# Patient Record
Sex: Female | Born: 1980
Health system: Southern US, Community
[De-identification: ages and names within clinical notes are randomized; demographics above are authoritative.]

## PROBLEM LIST (undated history)

## (undated) DIAGNOSIS — K219 Gastro-esophageal reflux disease without esophagitis: Secondary | ICD-10-CM

## (undated) DIAGNOSIS — T7840XA Allergy, unspecified, initial encounter: Secondary | ICD-10-CM

## (undated) DIAGNOSIS — F329 Major depressive disorder, single episode, unspecified: Secondary | ICD-10-CM

## (undated) DIAGNOSIS — N159 Renal tubulo-interstitial disease, unspecified: Secondary | ICD-10-CM

## (undated) DIAGNOSIS — E282 Polycystic ovarian syndrome: Secondary | ICD-10-CM

## (undated) DIAGNOSIS — F419 Anxiety disorder, unspecified: Secondary | ICD-10-CM

## (undated) DIAGNOSIS — R519 Headache, unspecified: Secondary | ICD-10-CM

## (undated) DIAGNOSIS — R51 Headache: Secondary | ICD-10-CM

## (undated) HISTORY — DX: Headache, unspecified: R51.9

## (undated) HISTORY — DX: Gastro-esophageal reflux disease without esophagitis: K21.9

## (undated) HISTORY — DX: Anxiety disorder, unspecified: F41.9

## (undated) HISTORY — DX: Headache: R51

## (undated) HISTORY — DX: Allergy, unspecified, initial encounter: T78.40XA

## (undated) HISTORY — DX: Major depressive disorder, single episode, unspecified: F32.9

---

## 2003-06-01 DIAGNOSIS — J329 Chronic sinusitis, unspecified: Secondary | ICD-10-CM | POA: Insufficient documentation

## 2003-06-01 DIAGNOSIS — N926 Irregular menstruation, unspecified: Secondary | ICD-10-CM | POA: Insufficient documentation

## 2003-07-24 DIAGNOSIS — F418 Other specified anxiety disorders: Secondary | ICD-10-CM | POA: Insufficient documentation

## 2003-12-28 DIAGNOSIS — M2550 Pain in unspecified joint: Secondary | ICD-10-CM | POA: Insufficient documentation

## 2004-03-28 DIAGNOSIS — R9089 Other abnormal findings on diagnostic imaging of central nervous system: Secondary | ICD-10-CM | POA: Insufficient documentation

## 2009-03-26 DIAGNOSIS — G43909 Migraine, unspecified, not intractable, without status migrainosus: Secondary | ICD-10-CM | POA: Insufficient documentation

## 2009-07-18 DIAGNOSIS — Z3400 Encounter for supervision of normal first pregnancy, unspecified trimester: Secondary | ICD-10-CM | POA: Insufficient documentation

## 2009-11-06 DIAGNOSIS — E039 Hypothyroidism, unspecified: Secondary | ICD-10-CM | POA: Insufficient documentation

## 2012-05-05 ENCOUNTER — Emergency Department (HOSPITAL_COMMUNITY)
Admission: EM | Admit: 2012-05-05 | Discharge: 2012-05-05 | Discharge status: Against Medical Advice | Payer: Medicaid Other | Attending: Emergency Medicine | Admitting: Emergency Medicine

## 2012-05-05 ENCOUNTER — Encounter (HOSPITAL_COMMUNITY): Payer: Self-pay | Admitting: Emergency Medicine

## 2012-05-05 DIAGNOSIS — Z8742 Personal history of other diseases of the female genital tract: Secondary | ICD-10-CM | POA: Insufficient documentation

## 2012-05-05 DIAGNOSIS — R197 Diarrhea, unspecified: Secondary | ICD-10-CM | POA: Insufficient documentation

## 2012-05-05 HISTORY — DX: Polycystic ovarian syndrome: E28.2

## 2012-05-05 LAB — CBC WITH DIFFERENTIAL/PLATELET
Eosinophils Absolute: 0.1 10*3/uL (ref 0.0–0.7)
Hemoglobin: 13.8 g/dL (ref 12.0–15.0)
Lymphocytes Relative: 32 % (ref 12–46)
Lymphs Abs: 2.5 10*3/uL (ref 0.7–4.0)
MCH: 29.2 pg (ref 26.0–34.0)
Monocytes Relative: 8 % (ref 3–12)
Neutro Abs: 4.6 10*3/uL (ref 1.7–7.7)
Neutrophils Relative %: 59 % (ref 43–77)
Platelets: 245 10*3/uL (ref 150–400)
RBC: 4.73 MIL/uL (ref 3.87–5.11)
WBC: 7.9 10*3/uL (ref 4.0–10.5)

## 2012-05-05 LAB — URINE MICROSCOPIC-ADD ON

## 2012-05-05 LAB — URINALYSIS, ROUTINE W REFLEX MICROSCOPIC
Bilirubin Urine: NEGATIVE
Nitrite: NEGATIVE
Specific Gravity, Urine: 1.015 (ref 1.005–1.030)
Urobilinogen, UA: 1 mg/dL (ref 0.0–1.0)
pH: 7.5 (ref 5.0–8.0)

## 2012-05-05 LAB — COMPREHENSIVE METABOLIC PANEL
ALT: 26 U/L (ref 0–35)
Alkaline Phosphatase: 70 U/L (ref 39–117)
BUN: 12 mg/dL (ref 6–23)
Chloride: 102 mEq/L (ref 96–112)
GFR calc Af Amer: >90 mL/min (ref >90)
Glucose, Bld: 88 mg/dL (ref 70–99)
Potassium: 3.8 mEq/L (ref 3.5–5.1)
Sodium: 136 mEq/L (ref 135–145)
Total Bilirubin: 0.2 mg/dL — ABNORMAL LOW (ref 0.3–1.2)
Total Protein: 7.3 g/dL (ref 6.0–8.3)

## 2012-05-05 NOTE — ED Notes (Signed)
Hx of pcos feels like that pain

## 2012-05-05 NOTE — ED Notes (Signed)
Had diarrhea on Sunday and then she had left lower abd pain hurts to walk hurts to void

## 2012-05-05 NOTE — ED Notes (Signed)
Pt called to re check vitals and no answer

## 2012-05-05 NOTE — ED Notes (Signed)
Called x 3 with no answer.

## 2012-05-09 DIAGNOSIS — R110 Nausea: Secondary | ICD-10-CM | POA: Insufficient documentation

## 2012-05-09 DIAGNOSIS — Z87448 Personal history of other diseases of urinary system: Secondary | ICD-10-CM | POA: Insufficient documentation

## 2012-05-09 DIAGNOSIS — N7013 Chronic salpingitis and oophoritis: Secondary | ICD-10-CM | POA: Insufficient documentation

## 2012-05-09 DIAGNOSIS — Z79899 Other long term (current) drug therapy: Secondary | ICD-10-CM | POA: Insufficient documentation

## 2012-05-10 ENCOUNTER — Encounter (HOSPITAL_COMMUNITY): Payer: Self-pay

## 2012-05-10 ENCOUNTER — Emergency Department (HOSPITAL_COMMUNITY)
Admission: EM | Admit: 2012-05-10 | Discharge: 2012-05-10 | Disposition: A | Discharge status: Home / Self Care | Payer: Medicaid Other | Attending: Emergency Medicine | Admitting: Emergency Medicine

## 2012-05-10 ENCOUNTER — Emergency Department (HOSPITAL_COMMUNITY): Payer: Medicaid Other

## 2012-05-10 DIAGNOSIS — N7011 Chronic salpingitis: Secondary | ICD-10-CM

## 2012-05-10 HISTORY — DX: Renal tubulo-interstitial disease, unspecified: N15.9

## 2012-05-10 LAB — COMPREHENSIVE METABOLIC PANEL
Alkaline Phosphatase: 68 U/L (ref 39–117)
BUN: 13 mg/dL (ref 6–23)
Chloride: 101 mEq/L (ref 96–112)
Creatinine, Ser: 0.56 mg/dL (ref 0.50–1.10)
GFR calc Af Amer: >90 mL/min (ref >90)
GFR calc non Af Amer: >90 mL/min (ref >90)
Glucose, Bld: 80 mg/dL (ref 70–99)
Potassium: 3.4 mEq/L — ABNORMAL LOW (ref 3.5–5.1)
Total Bilirubin: 0.3 mg/dL (ref 0.3–1.2)

## 2012-05-10 LAB — LIPASE, BLOOD: Lipase: 24 U/L (ref 11–59)

## 2012-05-10 LAB — CBC WITH DIFFERENTIAL/PLATELET
HCT: 41.1 % (ref 36.0–46.0)
Hemoglobin: 14.2 g/dL (ref 12.0–15.0)
Lymphs Abs: 2.7 10*3/uL (ref 0.7–4.0)
MCH: 29.2 pg (ref 26.0–34.0)
Monocytes Absolute: 0.7 10*3/uL (ref 0.1–1.0)
Monocytes Relative: 10 % (ref 3–12)
Neutro Abs: 3.5 10*3/uL (ref 1.7–7.7)
Neutrophils Relative %: 50 % (ref 43–77)
RBC: 4.87 MIL/uL (ref 3.87–5.11)

## 2012-05-10 LAB — URINALYSIS, ROUTINE W REFLEX MICROSCOPIC
Bilirubin Urine: NEGATIVE
Ketones, ur: NEGATIVE mg/dL
Nitrite: NEGATIVE
Protein, ur: NEGATIVE mg/dL
Urobilinogen, UA: 0.2 mg/dL (ref 0.0–1.0)

## 2012-05-10 MED ORDER — ACETAMINOPHEN 500 MG PO TABS: 500.0000 mg | ORAL_TABLET | Freq: Four times a day (QID) | ORAL | Status: DC | PRN

## 2012-05-10 MED ORDER — IOHEXOL 300 MG/ML  SOLN
100.0000 mL | Freq: Once | INTRAMUSCULAR | Status: AC | PRN
  Administered 2012-05-10: 100 mL via INTRAVENOUS

## 2012-05-10 MED ORDER — MORPHINE SULFATE 4 MG/ML IJ SOLN
4.0000 mg | Freq: Once | INTRAMUSCULAR | Status: AC
  Administered 2012-05-10: 4 mg via INTRAVENOUS
  Filled 2012-05-10: qty 1

## 2012-05-10 MED ORDER — SODIUM CHLORIDE 0.9 % IV SOLN: Freq: Once | INTRAVENOUS | Status: DC

## 2012-05-10 MED ORDER — OXYCODONE-ACETAMINOPHEN 5-325 MG PO TABS: 2.0000 | ORAL_TABLET | ORAL | Status: DC | PRN

## 2012-05-10 MED ORDER — ONDANSETRON HCL 4 MG/2ML IJ SOLN
4.0000 mg | Freq: Once | INTRAMUSCULAR | Status: AC
  Administered 2012-05-10: 4 mg via INTRAVENOUS
  Filled 2012-05-10: qty 2

## 2012-05-10 NOTE — Discharge Instructions (Signed)
Take tylenol as needed for pain. Tylenol is safe to take during breastfeeding. You may take Percocet for pain separate from tylenol since Percocet contains tylenol. Do not take Percocet while breastfeeding, as there is limited data concerning the safety of Percocet use during lactation. Follow up with your OBGYN for further evaluation/management of your hydrosalpinx. Refer to the information below for more details regarding your diagnosis. Return to the ED with worsening or concerning symptoms.    HYDROSALPINX The fallopian tubes are attached to the uterus (womb). Normally, the fallopian tube picks up an egg each month as it is released (or ovulated) from the ovary. Sperm can travel from the vagina through the uterus and will eventually meet the egg in one of the two tubes. Fertilization is when the egg and sperm join together to form an embryo (fertilized egg). The embryo travels through the tube into the uterus. When it reaches the uterus, it can implant into the uterine wall and develop into a baby. However, an old infection can cause the tubes to fill with fluid and enlarge (dilate). When this happens, the tube is called a hydrosalpinx. A normal pregnancy in the uterus cannot occur because the tube may be severely damaged and blocked. A blockage will not allow the egg and sperm to meet. What causes a hydrosalpinx? Hydrosalpinx is commonly caused by an old infection in the fallopian tubes. These infections may be caused by a sexually transmitted disease. Other causes include previous surgery (particularly surgeries on the tube) or severe adhesions of your pelvis. What are the symptoms of hydrosalpinx? Some women with hydrosalpinx may have constant or frequent pain in their lower belly or abdomen. A vaginal discharge can also be associated with this condition. Other women may not have any symptoms.

## 2012-05-10 NOTE — ED Notes (Signed)
Pt presents with NAD- pt c/o of lower stomach pain x 10 days.  LBM today normal. Seen at [facility] yet had to leave without getting results.  Pain continues

## 2012-05-10 NOTE — ED Provider Notes (Signed)
6:17 AM Patient signed out to me by Earley Favor, NP. Patient presents with history of abdominal pain. Patient disposition is pending abdominal CT scan. If negative, patient can be discharged with follow up.   7:26 AM Patient's abdominal CT shows left sided hydrosalpinx. Patient will follow up with her OBGYN for further evaluation and management. Patient informed of results and agreeable to plan.   Emilia Beck, [place] 05/10/12 [phone]

## 2012-05-10 NOTE — ED Provider Notes (Signed)
Medical screening examination/treatment/procedure(s) were performed by non-physician practitioner and as supervising physician I was immediately available for consultation/collaboration.  Sunnie Nielsen, MD 05/10/12 [phone]

## 2012-05-10 NOTE — ED Provider Notes (Signed)
History     CSN: [id number]  Arrival date & time 05/09/12  2353   First MD Initiated Contact with Patient 05/10/12 0324      Chief Complaint  Patient presents with  . Abdominal Pain  . Nausea    (Consider location/radiation/quality/duration/timing/severity/associated sxs/prior treatment) HPI Comments: Patient with 10 day of suprapubic and LLQ pain, soft stools and tonight nausea.  Denies fevers  Has Hx PCOS with IUD in place has not had menstrual cycle in over 3 yrs since IUD placed  Has Hx multiple ovarian cysts   Patient is a 31 y.o. female presenting with abdominal pain. The history is provided by the patient.  Abdominal Pain The primary symptoms of the illness include abdominal pain and nausea. The primary symptoms of the illness do not include fever, vomiting, diarrhea, dysuria, vaginal discharge or vaginal bleeding. The onset of the illness was gradual.  Symptoms associated with the illness do not include urgency.    Past Medical History  Diagnosis Date  . PCOS (polycystic ovarian syndrome)   . Kidney infection     History reviewed. No pertinent past surgical history.  No family history on file.  History  Substance Use Topics  . Smoking status: Never Smoker   . Smokeless tobacco: Not on file  . Alcohol Use: No    OB History    Grav Para Term Preterm Abortions TAB SAB Ect Mult Living                  Review of Systems  Constitutional: Negative for fever.  Gastrointestinal: Positive for nausea and abdominal pain. Negative for vomiting and diarrhea.  Genitourinary: Negative for dysuria, urgency, flank pain, vaginal bleeding, vaginal discharge, vaginal pain and pelvic pain.  Skin: Negative for rash and wound.    Allergies  Asa  Home Medications   Current Outpatient Rx  Name  Route  Sig  Dispense  Refill  . LEVONORGESTREL 20 MCG/24HR IU IUD   Intrauterine   1 each by Intrauterine route continuous.           BP 126/79  Pulse 88  Temp 98.1 F  (36.7 C) (Oral)  Resp 18  Ht 5\' 10"  (1.778 m)  Wt 184 lb (83.462 kg)  BMI 26.40 kg/m2  SpO2 100%  Physical Exam  Constitutional: She is oriented to person, place, and time. She appears well-developed and well-nourished.  HENT:  Head: Normocephalic.  Eyes: Pupils are equal, round, and reactive to light.  Neck: Normal range of motion.  Cardiovascular: Normal rate.   Pulmonary/Chest: Effort normal.  Abdominal: Soft. Bowel sounds are normal. She exhibits no distension. There is tenderness in the suprapubic area and left lower quadrant. There is no rebound. Hernia confirmed negative in the left inguinal area.    Musculoskeletal: Normal range of motion.  Neurological: She is alert and oriented to person, place, and time.  Skin: Skin is warm. No erythema.    ED Course  Procedures (including critical care time)  Labs Reviewed  COMPREHENSIVE METABOLIC PANEL - Abnormal; Notable for the following:    Potassium 3.4 (*)     All other components within normal limits  CBC WITH DIFFERENTIAL  LIPASE, BLOOD  URINALYSIS, ROUTINE W REFLEX MICROSCOPIC  PREGNANCY, URINE  CBC   No results found.   No diagnosis found.    MDM   Transfer of care to net shift who will review Ct Scan        Arman Filter, NP 05/10/12 2114

## 2012-05-11 NOTE — ED Provider Notes (Signed)
Medical screening examination/treatment/procedure(s) were performed by non-physician practitioner and as supervising physician I was immediately available for consultation/collaboration.  Sunnie Nielsen, MD 05/11/12 [phone]

## 2012-12-30 ENCOUNTER — Other Ambulatory Visit (HOSPITAL_COMMUNITY)
Admission: RE | Admit: 2012-12-30 | Discharge: 2012-12-30 | Disposition: A | Discharge status: Home / Self Care | Payer: Medicaid Other | Source: Ambulatory Visit | Attending: Obstetrics and Gynecology | Admitting: Obstetrics and Gynecology

## 2012-12-30 ENCOUNTER — Other Ambulatory Visit: Payer: Self-pay | Admitting: Obstetrics and Gynecology

## 2012-12-30 DIAGNOSIS — Z113 Encounter for screening for infections with a predominantly sexual mode of transmission: Secondary | ICD-10-CM | POA: Insufficient documentation

## 2012-12-30 DIAGNOSIS — Z01419 Encounter for gynecological examination (general) (routine) without abnormal findings: Secondary | ICD-10-CM | POA: Insufficient documentation

## 2012-12-30 DIAGNOSIS — Z1151 Encounter for screening for human papillomavirus (HPV): Secondary | ICD-10-CM | POA: Insufficient documentation

## 2013-04-08 ENCOUNTER — Other Ambulatory Visit (HOSPITAL_COMMUNITY): Payer: Self-pay | Admitting: Obstetrics and Gynecology

## 2013-04-08 DIAGNOSIS — N949 Unspecified condition associated with female genital organs and menstrual cycle: Secondary | ICD-10-CM

## 2013-04-12 ENCOUNTER — Ambulatory Visit (HOSPITAL_COMMUNITY)
Admission: RE | Admit: 2013-04-12 | Discharge: 2013-04-12 | Disposition: A | Discharge status: Home / Self Care | Payer: BC Managed Care – PPO | Source: Ambulatory Visit | Attending: Obstetrics and Gynecology | Admitting: Obstetrics and Gynecology

## 2013-04-12 ENCOUNTER — Other Ambulatory Visit (HOSPITAL_COMMUNITY): Payer: Self-pay | Admitting: Obstetrics and Gynecology

## 2013-04-12 DIAGNOSIS — N949 Unspecified condition associated with female genital organs and menstrual cycle: Secondary | ICD-10-CM

## 2013-04-12 DIAGNOSIS — N83209 Unspecified ovarian cyst, unspecified side: Secondary | ICD-10-CM | POA: Insufficient documentation

## 2013-04-12 DIAGNOSIS — Z30431 Encounter for routine checking of intrauterine contraceptive device: Secondary | ICD-10-CM | POA: Insufficient documentation

## 2013-04-12 DIAGNOSIS — E282 Polycystic ovarian syndrome: Secondary | ICD-10-CM | POA: Insufficient documentation

## 2014-09-27 ENCOUNTER — Encounter: Payer: Self-pay | Admitting: Diagnostic Neuroimaging

## 2014-09-27 ENCOUNTER — Ambulatory Visit (INDEPENDENT_AMBULATORY_CARE_PROVIDER_SITE_OTHER): Payer: BC Managed Care – PPO | Admitting: Diagnostic Neuroimaging

## 2014-09-27 VITALS — BP 101/73 | HR 76 | Temp 97.9°F | Resp 16 | Ht 69.0 in | Wt 200.0 lb

## 2014-09-27 DIAGNOSIS — G43019 Migraine without aura, intractable, without status migrainosus: Secondary | ICD-10-CM | POA: Diagnosis not present

## 2014-09-27 MED ORDER — TOPIRAMATE 25 MG PO TABS: 50.0000 mg | ORAL_TABLET | Freq: Every day | ORAL | Status: DC

## 2014-09-27 NOTE — Progress Notes (Signed)
[facility]  PATIENT: Heather Gardner DOB: 1981/03/03  REFERRING CLINICIAN: [name], MD HISTORY FROM: patient  REASON FOR VISIT: new consult    HISTORICAL  CHIEF COMPLAINT:  Chief Complaint  Patient presents with  . Headaches    Rm 6/ New patient     HISTORY OF PRESENT ILLNESS:   34 year old right-handed female with polycystic ovarian syndrome, migraine, depression, here for evaluation of headaches. Patient referred for consultation by Dr. [name].  Patient developed migraine headaches in her 58s. She describes occipital, posterior, throbbing headaches with photophobia, phonophobia, nausea. Patient was able to control headaches in the past with over-the-counter medications and more recently with topiramate and Maxalt. Patient also has had nocturnal vertex headaches with severe vomiting, shaking sensation, slurred speech, burning sensation on the top of her head. Headaches improved in her late 11s and early 83s.  Over past 1-2 months headaches have become more severe. She has missed 8 days of work recently due to severe headaches. She is again having 2 types of headaches. The more troublesome headaches are the nocturnal burning severe headaches. No specific triggering factors. Headaches seem to be worse in the springtime. She has been on antinausea medications, tramadol, ibuprofen and Tylenol without relief.   REVIEW OF SYSTEMS: Full 14 system review of systems performed and notable only for 2 much sleep decreased energy tremor slurred speech developed swelling headache sleepiness restless legs fevers and chills fatigue.  ALLERGIES: Allergies  Allergen Reactions  . Asa [Aspirin] Other (See Comments)    Patient stated that she can not take any blood thinners    HOME MEDICATIONS: Outpatient Prescriptions Prior to Visit  Medication Sig Dispense Refill  . FLUoxetine (PROZAC) 20 MG capsule     . ondansetron (ZOFRAN) 4 MG tablet     . promethazine (PHENERGAN)  25 MG tablet     . rizatriptan (MAXALT) 10 MG tablet     . topiramate (TOPAMAX) 25 MG tablet 75 mg.     . traMADol (ULTRAM) 50 MG tablet     . levonorgestrel (MIRENA) 20 MCG/24HR IUD 1 each by Intrauterine route continuous.     No facility-administered medications prior to visit.    PAST MEDICAL HISTORY: Past Medical History  Diagnosis Date  . PCOS (polycystic ovarian syndrome)   . Kidney infection   . Headache   . Major depression     PAST SURGICAL HISTORY: No past surgical history on file.  FAMILY HISTORY: Family History  Problem Relation Age of Onset  . Heart disease Mother   . Prostate cancer Father   . Throat cancer Father   . Kidney failure Maternal Grandmother   . Brain cancer Maternal Grandfather   . Lung cancer Maternal Grandfather   . Autism Son     SOCIAL HISTORY:  History   Social History  . Marital Status: Married    Spouse Name: N/A  . Number of Children: 1  . Years of Education: N/A   Occupational History  . [employer]    Social History Main Topics  . Smoking status: Never Smoker   . Smokeless tobacco: Not on file  . Alcohol Use: No  . Drug Use: No  . Sexual Activity: Not on file   Other Topics Concern  . Not on file   Social History Narrative     PHYSICAL EXAM  Filed Vitals:   09/27/14 0938  BP: 101/73  Pulse: 76  Temp: 97.9 F (36.6 C)  TempSrc: Oral  Resp: 16  Height: 5\' 9"  (1.753 m)  Weight: 200 lb (90.719 kg)    Body mass index is 29.52 kg/(m^2).   Visual Acuity Screening   Right eye Left eye Both eyes  Without correction:     With correction: 20/30 20/30 20/20     No flowsheet data found.  GENERAL EXAM: Patient is in no distress; well developed, nourished and groomed; neck is supple  CARDIOVASCULAR: Regular rate and rhythm, no murmurs, no carotid bruits  NEUROLOGIC: MENTAL STATUS: awake, alert, oriented to person, place and time, recent and remote memory intact, normal attention and  concentration, language fluent, comprehension intact, naming intact, fund of knowledge appropriate CRANIAL NERVE: no papilledema on fundoscopic exam, pupils equal and reactive to light, visual fields full to confrontation, extraocular muscles intact, no nystagmus, facial sensation and strength symmetric, hearing intact, palate elevates symmetrically, uvula midline, shoulder shrug symmetric, tongue midline. MOTOR: normal bulk and tone, full strength in the BUE, BLE SENSORY: normal and symmetric to light touch, pinprick, temperature, vibration COORDINATION: finger-nose-finger, fine finger movements normal REFLEXES: deep tendon reflexes present and symmetric GAIT/STATION: narrow based gait; romberg is negative    DIAGNOSTIC DATA (LABS, IMAGING, TESTING) - I reviewed patient records, labs, notes, testing and imaging myself where available.  Lab Results  Component Value Date   WBC 6.9 05/10/2012   HGB 14.2 05/10/2012   HCT 41.1 05/10/2012   MCV 84.4 05/10/2012   PLT 223 05/10/2012      Component Value Date/Time   NA 135 05/10/2012 0230   K 3.4* 05/10/2012 0230   CL 101 05/10/2012 0230   CO2 24 05/10/2012 0230   GLUCOSE 80 05/10/2012 0230   BUN 13 05/10/2012 0230   CREATININE 0.56 05/10/2012 0230   CALCIUM 9.3 05/10/2012 0230   PROT 7.4 05/10/2012 0230   ALBUMIN 3.9 05/10/2012 0230   AST 22 05/10/2012 0230   ALT 25 05/10/2012 0230   ALKPHOS 68 05/10/2012 0230   BILITOT 0.3 05/10/2012 0230   GFRNONAA >90 05/10/2012 0230   GFRAA >90 05/10/2012 0230   No results found for: CHOL, HDL, LDLCALC, LDLDIRECT, TRIG, CHOLHDL No results found for: HGBA1C No results found for: VITAMINB12 No results found for: TSH  March 2016 MRI brain ([facility]) - normal per referring MD notes    ASSESSMENT AND PLAN  34 y.o. year old female here with history of migraine since 2000's, now with increasing headaches. Normal neuro exam and MRI brain. 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  PLAN: - Increase topiramate to 50 mg twice a day. - Try adding on gabapentin 300 mg in the evening or at bedtime. - Come back to clinic on Friday morning for depacon infusion (valproic acid).  Return in about 1 month (around 10/28/2014) for revisit.    Penni Bombard, MD 10/14/3843, 36:46 AM Certified in Neurology, Neurophysiology and Neuroimaging  [facility] [address], [place], [place] [address] [phone]

## 2014-09-27 NOTE — Patient Instructions (Addendum)
Increase topiramate to 50 mg twice a day.  Try adding on gabapentin 300 mg in the evening or at bedtime.  Come back to clinic on Friday morning for depacon infusion (valproic acid).

## 2014-09-28 ENCOUNTER — Telehealth: Payer: Self-pay

## 2014-09-28 NOTE — Telephone Encounter (Signed)
Patient calling per Dr. Leta Baptist 09/27/14 Office Visit note patient to come have Depacon infusion on 09/29/14.  Spoke with Dr. Felecia Shelling to advise infusion nurse of order. Patient to arrive at 10am.

## 2014-10-02 ENCOUNTER — Telehealth: Payer: Self-pay | Admitting: Diagnostic Neuroimaging

## 2014-10-02 MED ORDER — GABAPENTIN 300 MG PO CAPS: 300.0000 mg | ORAL_CAPSULE | Freq: Every evening | ORAL | Status: DC

## 2014-10-02 NOTE — Telephone Encounter (Signed)
Last OV note says: Try adding on gabapentin 300 mg in the evening or at bedtime. It does not appear this drug is on med list.  I have added the Rx and sent it to the pharmacy.  I called the patient back, got no answer.  Left message.

## 2014-10-02 NOTE — Telephone Encounter (Signed)
Patient is calling to let you know the increased dosage of topiramate 25 mg to 50 mg did not work.  She stated you had another Rx to call in for her but the topiramate was called in again instead. Patient still has a bad headache.  Please call.

## 2014-10-19 ENCOUNTER — Ambulatory Visit (INDEPENDENT_AMBULATORY_CARE_PROVIDER_SITE_OTHER): Payer: BC Managed Care – PPO | Admitting: Diagnostic Neuroimaging

## 2014-10-19 ENCOUNTER — Encounter: Payer: Self-pay | Admitting: Diagnostic Neuroimaging

## 2014-10-19 VITALS — BP 110/69 | HR 78 | Ht 69.0 in | Wt 187.1 lb

## 2014-10-19 DIAGNOSIS — G43019 Migraine without aura, intractable, without status migrainosus: Secondary | ICD-10-CM

## 2014-10-19 MED ORDER — INDOMETHACIN 50 MG PO CAPS: 50.0000 mg | ORAL_CAPSULE | Freq: Two times a day (BID) | ORAL | Status: DC | PRN

## 2014-10-19 MED ORDER — TOPIRAMATE ER 100 MG PO CAP24: 100.0000 mg | ORAL_CAPSULE | Freq: Every day | ORAL | Status: DC

## 2014-10-19 MED ORDER — GABAPENTIN 300 MG PO CAPS: 300.0000 mg | ORAL_CAPSULE | Freq: Three times a day (TID) | ORAL | Status: DC

## 2014-10-19 NOTE — Patient Instructions (Signed)
Increase gabapentin up to 300mg  twice a aday, then up to three times per day.  Change topiramate to extended release 100mg  at bedtime if insurance covers cost.  Try indomethacin 50mg  as needed for breakthrough headaches.

## 2014-10-19 NOTE — Progress Notes (Signed)
[facility]  PATIENT: Heather Gardner DOB: 1981/02/12  REFERRING CLINICIAN: [name], MD HISTORY FROM: patient  REASON FOR VISIT: follow up   HISTORICAL  CHIEF COMPLAINT:  Chief Complaint  Patient presents with  . Follow-up    migraine    HISTORY OF PRESENT ILLNESS:   UPDATE 10/19/14: Since last visit, continues with intermittent HA (2 types as per last note). Tried TPX 50mg  BID without relief, then added gabapentin 300mg  daily (mild relief) and reduce TPX to 75mg  at bedtime. Occ uses rizatriptan, but saves it for severe HA only. Still missing up to 4 days of work per month. Some ipsilateral "heat" sensation with headache, but no lacrimation or nasal congestions.  No caffeine or alcohol. She eats clean, few carbs, mainly veggies and protein. Sleeps 8-9 hrs per night. Lives with 1 child at home (who is autistic).   Reports history of venous angioma x 2 in the brain, dx'd many years ago.   PRIOR HPI (09/27/14): 34 year old right-handed female with polycystic ovarian syndrome, migraine, depression, here for evaluation of headaches. Patient referred for consultation by Dr. [name]. Patient developed migraine headaches in her 75s. She describes occipital, posterior, throbbing headaches with photophobia, phonophobia, nausea. Patient was able to control headaches in the past with over-the-counter medications and more recently with topiramate and Maxalt. Patient also has had nocturnal vertex headaches with severe vomiting, shaking sensation, slurred speech, burning sensation on the top of her head. Headaches improved in her late 40s and early 53s. Over past 1-2 months headaches have become more severe. She has missed 8 days of work recently due to severe headaches. She is again having 2 types of headaches. The more troublesome headaches are the nocturnal burning severe headaches. No specific triggering factors. Headaches seem to be worse in the springtime. She has been on  antinausea medications, tramadol, ibuprofen and Tylenol without relief.   REVIEW OF SYSTEMS: Full 14 system review of systems performed and notable only for headache.   ALLERGIES: Allergies  Allergen Reactions  . Asa [Aspirin] Other (See Comments)    Patient stated that she can not take any blood thinners    HOME MEDICATIONS: Outpatient Prescriptions Prior to Visit  Medication Sig Dispense Refill  . FLUoxetine (PROZAC) 20 MG capsule     . levonorgestrel (MIRENA) 20 MCG/24HR IUD 1 each by Intrauterine route continuous.    . ondansetron (ZOFRAN) 4 MG tablet     . promethazine (PHENERGAN) 25 MG tablet     . traMADol (ULTRAM) 50 MG tablet     . gabapentin (NEURONTIN) 300 MG capsule Take 1 capsule (300 mg total) by mouth every evening. 30 capsule 3  . topiramate (TOPAMAX) 25 MG tablet Take 2 tablets (50 mg total) by mouth daily. 120 tablet 6  . rizatriptan (MAXALT) 10 MG tablet      No facility-administered medications prior to visit.    PAST MEDICAL HISTORY: Past Medical History  Diagnosis Date  . PCOS (polycystic ovarian syndrome)   . Kidney infection   . Headache   . Major depression     PAST SURGICAL HISTORY: History reviewed. No pertinent past surgical history.  FAMILY HISTORY: Family History  Problem Relation Age of Onset  . Heart disease Mother   . Prostate cancer Father   . Throat cancer Father   . Kidney failure Maternal Grandmother   . Brain cancer Maternal Grandfather   . Lung cancer Maternal Grandfather   . Autism Son     SOCIAL HISTORY:  History   Social History  . Marital Status: Married    Spouse Name: N/A  . Number of Children: 1  . Years of Education: N/A   Occupational History  . [employer]    Social History Main Topics  . Smoking status: Never Smoker   . Smokeless tobacco: Not on file  . Alcohol Use: No  . Drug Use: No  . Sexual Activity: Not on file   Other Topics Concern  . Not on file   Social History Narrative      PHYSICAL EXAM  Filed Vitals:   10/19/14 1604  BP: 110/69  Pulse: 78  Height: 5\' 9"  (1.753 m)  Weight: 187 lb 1.6 oz (84.868 kg)    Body mass index is 27.62 kg/(m^2).  No exam data present  No flowsheet data found.  GENERAL EXAM: Patient is in no distress; well developed, nourished and groomed; neck is supple  CARDIOVASCULAR: Regular rate and rhythm, no murmurs, no carotid bruits  NEUROLOGIC: MENTAL STATUS: awake, alert, language fluent, comprehension intact, naming intact, fund of knowledge appropriate CRANIAL NERVE: no papilledema on fundoscopic exam, pupils equal and reactive to light, visual fields full to confrontation, extraocular muscles intact, no nystagmus, facial sensation and strength symmetric, hearing intact, palate elevates symmetrically, uvula midline, shoulder shrug symmetric, tongue midline. MOTOR: normal bulk and tone, full strength in the BUE, BLE SENSORY: normal and symmetric to light touch  COORDINATION: finger-nose-finger, fine finger movements normal REFLEXES: deep tendon reflexes present and symmetric GAIT/STATION: narrow based gait; romberg is negative    DIAGNOSTIC DATA (LABS, IMAGING, TESTING) - I reviewed patient records, labs, notes, testing and imaging myself where available.  Lab Results  Component Value Date   WBC 6.9 05/10/2012   HGB 14.2 05/10/2012   HCT 41.1 05/10/2012   MCV 84.4 05/10/2012   PLT 223 05/10/2012      Component Value Date/Time   NA 135 05/10/2012 0230   K 3.4* 05/10/2012 0230   CL 101 05/10/2012 0230   CO2 24 05/10/2012 0230   GLUCOSE 80 05/10/2012 0230   BUN 13 05/10/2012 0230   CREATININE 0.56 05/10/2012 0230   CALCIUM 9.3 05/10/2012 0230   PROT 7.4 05/10/2012 0230   ALBUMIN 3.9 05/10/2012 0230   AST 22 05/10/2012 0230   ALT 25 05/10/2012 0230   ALKPHOS 68 05/10/2012 0230   BILITOT 0.3 05/10/2012 0230   GFRNONAA >90 05/10/2012 0230   GFRAA >90 05/10/2012 0230   No results found for: CHOL, HDL,  LDLCALC, LDLDIRECT, TRIG, CHOLHDL No results found for: HGBA1C No results found for: VITAMINB12 No results found for: TSH  March 2016 MRI brain ([facility]) - normal per referring MD notes    ASSESSMENT AND PLAN  34 y.o. year old female here with history of migraine since 2000's, now with increasing headaches. Normal neuro exam and MRI brain. 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   PLAN: - Increase gabapentin up to 300mg  twice a a day, then up to three times per day. - Change topiramate to extended release 100mg  at bedtime if insurance covers cost. - Try indomethacin 50mg  as needed for breakthrough headaches.  Meds ordered this encounter  Medications  . Topiramate ER 100 MG CP24    Sig: Take 100 mg by mouth at bedtime.    Dispense:  30 capsule    Refill:  6  . gabapentin (NEURONTIN) 300 MG capsule    Sig: Take 1 capsule (  300 mg total) by mouth 3 (three) times daily.    Dispense:  90 capsule    Refill:  6  . indomethacin (INDOCIN) 50 MG capsule    Sig: Take 1 capsule (50 mg total) by mouth 2 (two) times daily as needed.    Dispense:  30 capsule    Refill:  3   Return in about 7 weeks (around 12/07/2014).    Penni Bombard, MD 3/61/4431, 5:40 PM Certified in Neurology, Neurophysiology and Neuroimaging  [facility] [address] [place], [place] [address] [phone]

## 2014-10-31 DIAGNOSIS — Z0289 Encounter for other administrative examinations: Secondary | ICD-10-CM

## 2014-11-01 ENCOUNTER — Telehealth: Payer: Self-pay | Admitting: *Deleted

## 2014-11-01 NOTE — Telephone Encounter (Signed)
Faxed a copy of the completed FMLA paperwork to the pts requested fax number, had a copy sent to MR to be scanned in.

## 2014-11-01 NOTE — Telephone Encounter (Signed)
Spoke with the pt on 10/31/14 about her FMLA paperwork. Told her that she would need to pay the $25 dollar fee and transferred her to [place].

## 2014-11-07 ENCOUNTER — Telehealth: Payer: Self-pay

## 2014-11-07 NOTE — Telephone Encounter (Signed)
Left VM to reschedule 12/15/2014, but this appointment time is ok.  Pt does not need to reschedule thanks

## 2014-11-07 NOTE — Telephone Encounter (Signed)
Pt was contacted to reschedule appt from bump list. VM was left informing pt to call.  When Rescheduling please schedule with Dr. Pearletha Forge in a 30 min slot.  Thanks

## 2014-12-08 ENCOUNTER — Telehealth: Payer: Self-pay | Admitting: *Deleted

## 2014-12-08 NOTE — Telephone Encounter (Signed)
Pt called Tina infusion RN who stated that the pt was calling in with a migraine w/ N/V asking for an infusion. She was here 09/29/14 for a depacon infusion 1000 mg.   Spoke with Dr. Felecia Shelling, work in doctor this am, who was ok with giving the pt a 1000 mg depacon infusion this am.  Called the pt who stated that she would be able to come in and get the infusion this morning. She thanked me

## 2014-12-12 ENCOUNTER — Ambulatory Visit: Payer: BC Managed Care – PPO | Admitting: Diagnostic Neuroimaging

## 2014-12-15 ENCOUNTER — Ambulatory Visit: Payer: Self-pay | Admitting: Diagnostic Neuroimaging

## 2015-07-26 ENCOUNTER — Encounter: Payer: Self-pay | Admitting: Obstetrics & Gynecology

## 2015-07-26 ENCOUNTER — Ambulatory Visit (INDEPENDENT_AMBULATORY_CARE_PROVIDER_SITE_OTHER): Payer: BC Managed Care – PPO | Admitting: Obstetrics & Gynecology

## 2015-07-26 VITALS — BP 114/79 | HR 99 | Resp 18 | Ht 70.0 in | Wt 198.0 lb

## 2015-07-26 DIAGNOSIS — Z1151 Encounter for screening for human papillomavirus (HPV): Secondary | ICD-10-CM

## 2015-07-26 DIAGNOSIS — Z01419 Encounter for gynecological examination (general) (routine) without abnormal findings: Secondary | ICD-10-CM

## 2015-07-26 DIAGNOSIS — Z30431 Encounter for routine checking of intrauterine contraceptive device: Secondary | ICD-10-CM | POA: Diagnosis not present

## 2015-07-26 DIAGNOSIS — Z124 Encounter for screening for malignant neoplasm of cervix: Secondary | ICD-10-CM

## 2015-07-26 DIAGNOSIS — Z975 Presence of (intrauterine) contraceptive device: Secondary | ICD-10-CM

## 2015-07-26 NOTE — Patient Instructions (Signed)
Preventive Care for Adults, Female A healthy lifestyle and preventive care can promote health and wellness. Preventive health guidelines for women include the following key practices.  A routine yearly physical is a good way to check with your health care provider about your health and preventive screening. It is a chance to share any concerns and updates on your health and to receive a thorough exam.  Visit your dentist for a routine exam and preventive care every 6 months. Brush your teeth twice a day and floss once a day. Good oral hygiene prevents tooth decay and gum disease.  The frequency of eye exams is based on your age, health, family medical history, use of contact lenses, and other factors. Follow your health care provider's recommendations for frequency of eye exams.  Eat a healthy diet. Foods like vegetables, fruits, whole grains, low-fat dairy products, and lean protein foods contain the nutrients you need without too many calories. Decrease your intake of foods high in solid fats, added sugars, and salt. Eat the right amount of calories for you.Get information about a proper diet from your health care provider, if necessary.  Regular physical exercise is one of the most important things you can do for your health. Most adults should get at least 150 minutes of moderate-intensity exercise (any activity that increases your heart rate and causes you to sweat) each week. In addition, most adults need muscle-strengthening exercises on 2 or more days a week.  Maintain a healthy weight. The body mass index (BMI) is a screening tool to identify possible weight problems. It provides an estimate of body fat based on height and weight. Your health care provider can find your BMI and can help you achieve or maintain a healthy weight.For adults 20 years and older:  A BMI below 18.5 is considered underweight.  A BMI of 18.5 to 24.9 is normal.  A BMI of 25 to 29.9 is considered overweight.  A  BMI of 30 and above is considered obese.  Maintain normal blood lipids and cholesterol levels by exercising and minimizing your intake of saturated fat. Eat a balanced diet with plenty of fruit and vegetables. Blood tests for lipids and cholesterol should begin at age 45 and be repeated every 5 years. If your lipid or cholesterol levels are high, you are over 50, or you are at high risk for heart disease, you may need your cholesterol levels checked more frequently.Ongoing high lipid and cholesterol levels should be treated with medicines if diet and exercise are not working.  If you smoke, find out from your health care provider how to quit. If you do not use tobacco, do not start.  Lung cancer screening is recommended for adults aged 45-80 years who are at high risk for developing lung cancer because of a history of smoking. A yearly low-dose CT scan of the lungs is recommended for people who have at least a 30-pack-year history of smoking and are a current smoker or have quit within the past 15 years. A pack year of smoking is smoking an average of 1 pack of cigarettes a day for 1 year (for example: 1 pack a day for 30 years or 2 packs a day for 15 years). Yearly screening should continue until the smoker has stopped smoking for at least 15 years. Yearly screening should be stopped for people who develop a health problem that would prevent them from having lung cancer treatment.  If you are pregnant, do not drink alcohol. If you are  breastfeeding, be very cautious about drinking alcohol. If you are not pregnant and choose to drink alcohol, do not have more than 1 drink per day. One drink is considered to be 12 ounces (355 mL) of beer, 5 ounces (148 mL) of wine, or 1.5 ounces (44 mL) of liquor.  Avoid use of street drugs. Do not share needles with anyone. Ask for help if you need support or instructions about stopping the use of drugs.  High blood pressure causes heart disease and increases the risk  of stroke. Your blood pressure should be checked at least every 1 to 2 years. Ongoing high blood pressure should be treated with medicines if weight loss and exercise do not work.  If you are 55-79 years old, ask your health care provider if you should take aspirin to prevent strokes.  Diabetes screening is done by taking a blood sample to check your blood glucose level after you have not eaten for a certain period of time (fasting). If you are not overweight and you do not have risk factors for diabetes, you should be screened once every 3 years starting at age 45. If you are overweight or obese and you are 40-70 years of age, you should be screened for diabetes every year as part of your cardiovascular risk assessment.  Breast cancer screening is essential preventive care for women. You should practice "breast self-awareness." This means understanding the normal appearance and feel of your breasts and may include breast self-examination. Any changes detected, no matter how small, should be reported to a health care provider. Women in their 20s and 30s should have a clinical breast exam (CBE) by a health care provider as part of a regular health exam every 1 to 3 years. After age 40, women should have a CBE every year. Starting at age 40, women should consider having a mammogram (breast X-ray test) every year. Women who have a family history of breast cancer should talk to their health care provider about genetic screening. Women at a high risk of breast cancer should talk to their health care providers about having an MRI and a mammogram every year.  Breast cancer gene (BRCA)-related cancer risk assessment is recommended for women who have family members with BRCA-related cancers. BRCA-related cancers include breast, ovarian, tubal, and peritoneal cancers. Having family members with these cancers may be associated with an increased risk for harmful changes (mutations) in the breast cancer genes BRCA1 and  BRCA2. Results of the assessment will determine the need for genetic counseling and BRCA1 and BRCA2 testing.  Your health care provider may recommend that you be screened regularly for cancer of the pelvic organs (ovaries, uterus, and vagina). This screening involves a pelvic examination, including checking for microscopic changes to the surface of your cervix (Pap test). You may be encouraged to have this screening done every 3 years, beginning at age 21.  For women ages 30-65, health care providers may recommend pelvic exams and Pap testing every 3 years, or they may recommend the Pap and pelvic exam, combined with testing for human papilloma virus (HPV), every 5 years. Some types of HPV increase your risk of cervical cancer. Testing for HPV may also be done on women of any age with unclear Pap test results.  Other health care providers may not recommend any screening for nonpregnant women who are considered low risk for pelvic cancer and who do not have symptoms. Ask your health care provider if a screening pelvic exam is right for   you.  If you have had past treatment for cervical cancer or a condition that could lead to cancer, you need Pap tests and screening for cancer for at least 20 years after your treatment. If Pap tests have been discontinued, your risk factors (such as having a new sexual partner) need to be reassessed to determine if screening should resume. Some women have medical problems that increase the chance of getting cervical cancer. In these cases, your health care provider may recommend more frequent screening and Pap tests.  Colorectal cancer can be detected and often prevented. Most routine colorectal cancer screening begins at the age of 50 years and continues through age 75 years. However, your health care provider may recommend screening at an earlier age if you have risk factors for colon cancer. On a yearly basis, your health care provider may provide home test kits to check  for hidden blood in the stool. Use of a small camera at the end of a tube, to directly examine the colon (sigmoidoscopy or colonoscopy), can detect the earliest forms of colorectal cancer. Talk to your health care provider about this at age 50, when routine screening begins. Direct exam of the colon should be repeated every 5-10 years through age 75 years, unless early forms of precancerous polyps or small growths are found.  People who are at an increased risk for hepatitis B should be screened for this virus. You are considered at high risk for hepatitis B if:  You were born in a country where hepatitis B occurs often. Talk with your health care provider about which countries are considered high risk.  Your parents were born in a high-risk country and you have not received a shot to protect against hepatitis B (hepatitis B vaccine).  You have HIV or AIDS.  You use needles to inject street drugs.  You live with, or have sex with, someone who has hepatitis B.  You get hemodialysis treatment.  You take certain medicines for conditions like cancer, organ transplantation, and autoimmune conditions.  Hepatitis C blood testing is recommended for all people born from 1945 through 1965 and any individual with known risks for hepatitis C.  Practice safe sex. Use condoms and avoid high-risk sexual practices to reduce the spread of sexually transmitted infections (STIs). STIs include gonorrhea, chlamydia, syphilis, trichomonas, herpes, HPV, and human immunodeficiency virus (HIV). Herpes, HIV, and HPV are viral illnesses that have no cure. They can result in disability, cancer, and death.  You should be screened for sexually transmitted illnesses (STIs) including gonorrhea and chlamydia if:  You are sexually active and are younger than 24 years.  You are older than 24 years and your health care provider tells you that you are at risk for this type of infection.  Your sexual activity has changed  since you were last screened and you are at an increased risk for chlamydia or gonorrhea. Ask your health care provider if you are at risk.  If you are at risk of being infected with HIV, it is recommended that you take a prescription medicine daily to prevent HIV infection. This is called preexposure prophylaxis (PrEP). You are considered at risk if:  You are sexually active and do not regularly use condoms or know the HIV status of your partner(s).  You take drugs by injection.  You are sexually active with a partner who has HIV.  Talk with your health care provider about whether you are at high risk of being infected with HIV. If   you choose to begin PrEP, you should first be tested for HIV. You should then be tested every 3 months for as long as you are taking PrEP.  Osteoporosis is a disease in which the bones lose minerals and strength with aging. This can result in serious bone fractures or breaks. The risk of osteoporosis can be identified using a bone density scan. Women ages 67 years and over and women at risk for fractures or osteoporosis should discuss screening with their health care providers. Ask your health care provider whether you should take a calcium supplement or vitamin D to reduce the rate of osteoporosis.  Menopause can be associated with physical symptoms and risks. Hormone replacement therapy is available to decrease symptoms and risks. You should talk to your health care provider about whether hormone replacement therapy is right for you.  Use sunscreen. Apply sunscreen liberally and repeatedly throughout the day. You should seek shade when your shadow is shorter than you. Protect yourself by wearing long sleeves, pants, a wide-brimmed hat, and sunglasses year round, whenever you are outdoors.  Once a month, do a whole body skin exam, using a mirror to look at the skin on your back. Tell your health care provider of new moles, moles that have irregular borders, moles that  are larger than a pencil eraser, or moles that have changed in shape or color.  Stay current with required vaccines (immunizations).  Influenza vaccine. All adults should be immunized every year.  Tetanus, diphtheria, and acellular pertussis (Td, Tdap) vaccine. Pregnant women should receive 1 dose of Tdap vaccine during each pregnancy. The dose should be obtained regardless of the length of time since the last dose. Immunization is preferred during the 27th-36th week of gestation. An adult who has not previously received Tdap or who does not know her vaccine status should receive 1 dose of Tdap. This initial dose should be followed by tetanus and diphtheria toxoids (Td) booster doses every 10 years. Adults with an unknown or incomplete history of completing a 3-dose immunization series with Td-containing vaccines should begin or complete a primary immunization series including a Tdap dose. Adults should receive a Td booster every 10 years.  Varicella vaccine. An adult without evidence of immunity to varicella should receive 2 doses or a second dose if she has previously received 1 dose. Pregnant females who do not have evidence of immunity should receive the first dose after pregnancy. This first dose should be obtained before leaving the health care facility. The second dose should be obtained 4-8 weeks after the first dose.  Human papillomavirus (HPV) vaccine. Females aged 13-26 years who have not received the vaccine previously should obtain the 3-dose series. The vaccine is not recommended for use in pregnant females. However, pregnancy testing is not needed before receiving a dose. If a female is found to be pregnant after receiving a dose, no treatment is needed. In that case, the remaining doses should be delayed until after the pregnancy. Immunization is recommended for any person with an immunocompromised condition through the age of 61 years if she did not get any or all doses earlier. During the  3-dose series, the second dose should be obtained 4-8 weeks after the first dose. The third dose should be obtained 24 weeks after the first dose and 16 weeks after the second dose.  Zoster vaccine. One dose is recommended for adults aged 30 years or older unless certain conditions are present.  Measles, mumps, and rubella (MMR) vaccine. Adults born  before 1957 generally are considered immune to measles and mumps. Adults born in 1957 or later should have 1 or more doses of MMR vaccine unless there is a contraindication to the vaccine or there is laboratory evidence of immunity to each of the three diseases. A routine second dose of MMR vaccine should be obtained at least 28 days after the first dose for students attending postsecondary schools, health care workers, or international travelers. People who received inactivated measles vaccine or an unknown type of measles vaccine during 1963-1967 should receive 2 doses of MMR vaccine. People who received inactivated mumps vaccine or an unknown type of mumps vaccine before 1979 and are at high risk for mumps infection should consider immunization with 2 doses of MMR vaccine. For females of childbearing age, rubella immunity should be determined. If there is no evidence of immunity, females who are not pregnant should be vaccinated. If there is no evidence of immunity, females who are pregnant should delay immunization until after pregnancy. Unvaccinated health care workers born before 1957 who lack laboratory evidence of measles, mumps, or rubella immunity or laboratory confirmation of disease should consider measles and mumps immunization with 2 doses of MMR vaccine or rubella immunization with 1 dose of MMR vaccine.  Pneumococcal 13-valent conjugate (PCV13) vaccine. When indicated, a person who is uncertain of his immunization history and has no record of immunization should receive the PCV13 vaccine. All adults 65 years of age and older should receive this  vaccine. An adult aged 19 years or older who has certain medical conditions and has not been previously immunized should receive 1 dose of PCV13 vaccine. This PCV13 should be followed with a dose of pneumococcal polysaccharide (PPSV23) vaccine. Adults who are at high risk for pneumococcal disease should obtain the PPSV23 vaccine at least 8 weeks after the dose of PCV13 vaccine. Adults older than 35 years of age who have normal immune system function should obtain the PPSV23 vaccine dose at least 1 year after the dose of PCV13 vaccine.  Pneumococcal polysaccharide (PPSV23) vaccine. When PCV13 is also indicated, PCV13 should be obtained first. All adults aged 65 years and older should be immunized. An adult younger than age 65 years who has certain medical conditions should be immunized. Any person who resides in a nursing home or long-term care facility should be immunized. An adult smoker should be immunized. People with an immunocompromised condition and certain other conditions should receive both PCV13 and PPSV23 vaccines. People with human immunodeficiency virus (HIV) infection should be immunized as soon as possible after diagnosis. Immunization during chemotherapy or radiation therapy should be avoided. Routine use of PPSV23 vaccine is not recommended for American Indians, [place] Natives, or people younger than 65 years unless there are medical conditions that require PPSV23 vaccine. When indicated, people who have unknown immunization and have no record of immunization should receive PPSV23 vaccine. One-time revaccination 5 years after the first dose of PPSV23 is recommended for people aged 19-64 years who have chronic kidney failure, nephrotic syndrome, asplenia, or immunocompromised conditions. People who received 1-2 doses of PPSV23 before age 65 years should receive another dose of PPSV23 vaccine at age 65 years or later if at least 5 years have passed since the previous dose. Doses of PPSV23 are not  needed for people immunized with PPSV23 at or after age 65 years.  Meningococcal vaccine. Adults with asplenia or persistent complement component deficiencies should receive 2 doses of quadrivalent meningococcal conjugate (MenACWY-D) vaccine. The doses should be obtained   at least 2 months apart. Microbiologists working with certain meningococcal bacteria, [place] recruits, people at risk during an outbreak, and people who travel to or live in countries with a high rate of meningitis should be immunized. A first-year college student up through age 34 years who is living in a residence hall should receive a dose if she did not receive a dose on or after her 16th birthday. Adults who have certain high-risk conditions should receive one or more doses of vaccine.  Hepatitis A vaccine. Adults who wish to be protected from this disease, have certain high-risk conditions, work with hepatitis A-infected animals, work in hepatitis A research labs, or travel to or work in countries with a high rate of hepatitis A should be immunized. Adults who were previously unvaccinated and who anticipate close contact with an international adoptee during the first 60 days after arrival in the [place] from a country with a high rate of hepatitis A should be immunized.  Hepatitis B vaccine. Adults who wish to be protected from this disease, have certain high-risk conditions, may be exposed to blood or other infectious body fluids, are household contacts or sex partners of hepatitis B positive people, are clients or workers in certain care facilities, or travel to or work in countries with a high rate of hepatitis B should be immunized.  Haemophilus influenzae type b (Hib) vaccine. A previously unvaccinated person with asplenia or sickle cell disease or having a scheduled splenectomy should receive 1 dose of Hib vaccine. Regardless of previous immunization, a recipient of a hematopoietic stem cell transplant should receive a  3-dose series 6-12 months after her successful transplant. Hib vaccine is not recommended for adults with HIV infection. Preventive Services / Frequency Ages 35 to 4 years  Blood pressure check.** / Every 3-5 years.  Lipid and cholesterol check.** / Every 5 years beginning at age 60.  Clinical breast exam.** / Every 3 years for women in their 71s and 10s.  BRCA-related cancer risk assessment.** / For women who have family members with a BRCA-related cancer (breast, ovarian, tubal, or peritoneal cancers).  Pap test.** / Every 2 years from ages 76 through 26. Every 3 years starting at age 61 through age 76 or 93 with a history of 3 consecutive normal Pap tests.  HPV screening.** / Every 3 years from ages 37 through ages 60 to 51 with a history of 3 consecutive normal Pap tests.  Hepatitis C blood test.** / For any individual with known risks for hepatitis C.  Skin self-exam. / Monthly.  Influenza vaccine. / Every year.  Tetanus, diphtheria, and acellular pertussis (Tdap, Td) vaccine.** / Consult your health care provider. Pregnant women should receive 1 dose of Tdap vaccine during each pregnancy. 1 dose of Td every 10 years.  Varicella vaccine.** / Consult your health care provider. Pregnant females who do not have evidence of immunity should receive the first dose after pregnancy.  HPV vaccine. / 3 doses over 6 months, if 93 and younger. The vaccine is not recommended for use in pregnant females. However, pregnancy testing is not needed before receiving a dose.  Measles, mumps, rubella (MMR) vaccine.** / You need at least 1 dose of MMR if you were born in 1957 or later. You may also need a 2nd dose. For females of childbearing age, rubella immunity should be determined. If there is no evidence of immunity, females who are not pregnant should be vaccinated. If there is no evidence of immunity, females who are  pregnant should delay immunization until after pregnancy.  Pneumococcal  13-valent conjugate (PCV13) vaccine.** / Consult your health care provider.  Pneumococcal polysaccharide (PPSV23) vaccine.** / 1 to 2 doses if you smoke cigarettes or if you have certain conditions.  Meningococcal vaccine.** / 1 dose if you are age 68 to 8 years and a Market researcher living in a residence hall, or have one of several medical conditions, you need to get vaccinated against meningococcal disease. You may also need additional booster doses.  Hepatitis A vaccine.** / Consult your health care provider.  Hepatitis B vaccine.** / Consult your health care provider.  Haemophilus influenzae type b (Hib) vaccine.** / Consult your health care provider. Ages 7 to 53 years  Blood pressure check.** / Every year.  Lipid and cholesterol check.** / Every 5 years beginning at age 25 years.  Lung cancer screening. / Every year if you are aged 11-80 years and have a 30-pack-year history of smoking and currently smoke or have quit within the past 15 years. Yearly screening is stopped once you have quit smoking for at least 15 years or develop a health problem that would prevent you from having lung cancer treatment.  Clinical breast exam.** / Every year after age 48 years.  BRCA-related cancer risk assessment.** / For women who have family members with a BRCA-related cancer (breast, ovarian, tubal, or peritoneal cancers).  Mammogram.** / Every year beginning at age 41 years and continuing for as long as you are in good health. Consult with your health care provider.  Pap test.** / Every 3 years starting at age 65 years through age 37 or 70 years with a history of 3 consecutive normal Pap tests.  HPV screening.** / Every 3 years from ages 72 years through ages 60 to 40 years with a history of 3 consecutive normal Pap tests.  Fecal occult blood test (FOBT) of stool. / Every year beginning at age 21 years and continuing until age 5 years. You may not need to do this test if you get  a colonoscopy every 10 years.  Flexible sigmoidoscopy or colonoscopy.** / Every 5 years for a flexible sigmoidoscopy or every 10 years for a colonoscopy beginning at age 35 years and continuing until age 48 years.  Hepatitis C blood test.** / For all people born from [birth year] through 1965 and any individual with known risks for hepatitis C.  Skin self-exam. / Monthly.  Influenza vaccine. / Every year.  Tetanus, diphtheria, and acellular pertussis (Tdap/Td) vaccine.** / Consult your health care provider. Pregnant women should receive 1 dose of Tdap vaccine during each pregnancy. 1 dose of Td every 10 years.  Varicella vaccine.** / Consult your health care provider. Pregnant females who do not have evidence of immunity should receive the first dose after pregnancy.  Zoster vaccine.** / 1 dose for adults aged 30 years or older.  Measles, mumps, rubella (MMR) vaccine.** / You need at least 1 dose of MMR if you were born in 1957 or later. You may also need a second dose. For females of childbearing age, rubella immunity should be determined. If there is no evidence of immunity, females who are not pregnant should be vaccinated. If there is no evidence of immunity, females who are pregnant should delay immunization until after pregnancy.  Pneumococcal 13-valent conjugate (PCV13) vaccine.** / Consult your health care provider.  Pneumococcal polysaccharide (PPSV23) vaccine.** / 1 to 2 doses if you smoke cigarettes or if you have certain conditions.  Meningococcal vaccine.** /  Consult your health care provider.  Hepatitis A vaccine.** / Consult your health care provider.  Hepatitis B vaccine.** / Consult your health care provider.  Haemophilus influenzae type b (Hib) vaccine.** / Consult your health care provider. Ages 64 years and over  Blood pressure check.** / Every year.  Lipid and cholesterol check.** / Every 5 years beginning at age 23 years.  Lung cancer screening. / Every year if you  are aged 16-80 years and have a 30-pack-year history of smoking and currently smoke or have quit within the past 15 years. Yearly screening is stopped once you have quit smoking for at least 15 years or develop a health problem that would prevent you from having lung cancer treatment.  Clinical breast exam.** / Every year after age 74 years.  BRCA-related cancer risk assessment.** / For women who have family members with a BRCA-related cancer (breast, ovarian, tubal, or peritoneal cancers).  Mammogram.** / Every year beginning at age 44 years and continuing for as long as you are in good health. Consult with your health care provider.  Pap test.** / Every 3 years starting at age 58 years through age 22 or 39 years with 3 consecutive normal Pap tests. Testing can be stopped between 65 and 70 years with 3 consecutive normal Pap tests and no abnormal Pap or HPV tests in the past 10 years.  HPV screening.** / Every 3 years from ages 64 years through ages 70 or 61 years with a history of 3 consecutive normal Pap tests. Testing can be stopped between 65 and 70 years with 3 consecutive normal Pap tests and no abnormal Pap or HPV tests in the past 10 years.  Fecal occult blood test (FOBT) of stool. / Every year beginning at age 40 years and continuing until age 27 years. You may not need to do this test if you get a colonoscopy every 10 years.  Flexible sigmoidoscopy or colonoscopy.** / Every 5 years for a flexible sigmoidoscopy or every 10 years for a colonoscopy beginning at age 7 years and continuing until age 32 years.  Hepatitis C blood test.** / For all people born from [birth year] through 1965 and any individual with known risks for hepatitis C.  Osteoporosis screening.** / A one-time screening for women ages 30 years and over and women at risk for fractures or osteoporosis.  Skin self-exam. / Monthly.  Influenza vaccine. / Every year.  Tetanus, diphtheria, and acellular pertussis (Tdap/Td)  vaccine.** / 1 dose of Td every 10 years.  Varicella vaccine.** / Consult your health care provider.  Zoster vaccine.** / 1 dose for adults aged 35 years or older.  Pneumococcal 13-valent conjugate (PCV13) vaccine.** / Consult your health care provider.  Pneumococcal polysaccharide (PPSV23) vaccine.** / 1 dose for all adults aged 46 years and older.  Meningococcal vaccine.** / Consult your health care provider.  Hepatitis A vaccine.** / Consult your health care provider.  Hepatitis B vaccine.** / Consult your health care provider.  Haemophilus influenzae type b (Hib) vaccine.** / Consult your health care provider. ** Family history and personal history of risk and conditions may change your health care provider's recommendations.   This information is not intended to replace advice given to you by your health care provider. Make sure you discuss any questions you have with your health care provider.   Document Released: 08/12/2001 Document Revised: 07/07/2014 Document Reviewed: 11/11/2010 Elsevier Interactive Patient Education [employer].

## 2015-07-26 NOTE — Progress Notes (Signed)
GYNECOLOGY CLINIC ANNUAL PREVENTATIVE CARE ENCOUNTER NOTE  Subjective:   Heather Gardner is a 35 y.o. G50P1001 female here for a routine annual gynecologic exam.  Current complaints: none.  Has Mirena IUD in place for about 5.5 years, satisfied with method.   Denies abnormal vaginal bleeding, discharge, pelvic pain, problems with intercourse or other gynecologic concerns.    Gynecologic History No LMP recorded. Patient is not currently having periods (Reason: IUD). Contraception: IUD Last Pap: 2014. Results were: normal. No cervical dysplasia history.  Obstetric History OB History  Gravida Para Term Preterm AB SAB TAB Ectopic Multiple Living  1 1 1       1     # Outcome Date GA Lbr Len/2nd Weight Sex Delivery Anes PTL Lv  1 Term 09/25/09 [redacted]w[redacted]d  8 lb 9 oz (3.884 kg) M    Y      Past Medical History  Diagnosis Date  . PCOS (polycystic ovarian syndrome)   . Kidney infection   . Headache   . Major depression ([place])     History reviewed. No pertinent past surgical history.  Current Outpatient Prescriptions on File Prior to Visit  Medication Sig Dispense Refill  . gabapentin (NEURONTIN) 300 MG capsule Take 1 capsule (300 mg total) by mouth 3 (three) times daily. 90 capsule 6  . indomethacin (INDOCIN) 50 MG capsule Take 1 capsule (50 mg total) by mouth 2 (two) times daily as needed. 30 capsule 3  . levonorgestrel (MIRENA) 20 MCG/24HR IUD 1 each by Intrauterine route continuous.    . ondansetron (ZOFRAN) 4 MG tablet     . Topiramate ER 100 MG CP24 Take 100 mg by mouth at bedtime. 30 capsule 6  . promethazine (PHENERGAN) 25 MG tablet Reported on 07/26/2015    . rizatriptan (MAXALT) 10 MG tablet Reported on 07/26/2015    . traMADol (ULTRAM) 50 MG tablet Reported on 07/26/2015     No current facility-administered medications on file prior to visit.    Allergies  Allergen Reactions  . Asa [Aspirin] Other (See Comments)    Patient stated that she can not take any blood thinners     Social History   Social History  . Marital Status: Married    Spouse Name: N/A  . Number of Children: 1  . Years of Education: N/A   Occupational History  . [employer]    Social History Main Topics  . Smoking status: Never Smoker   . Smokeless tobacco: Not on file  . Alcohol Use: No  . Drug Use: No  . Sexual Activity: Yes    Birth Control/ Protection: IUD   Other Topics Concern  . Not on file   Social History Narrative    Family History  Problem Relation Age of Onset  . Heart disease Mother   . Cancer Mother     skin  . Prostate cancer Father   . Throat cancer Father   . Cancer Father     skin  . Kidney failure Maternal Grandmother   . Brain cancer Maternal Grandfather   . Lung cancer Maternal Grandfather   . Autism Son     The following portions of the patient's history were reviewed and updated as appropriate: allergies, current medications, past family history, past medical history, past social history, past surgical history and problem list.  Review of Systems Pertinent items noted in HPI and remainder of comprehensive ROS otherwise negative.   Objective:  BP 114/79 mmHg  Pulse 99  Resp 18  Ht 5\' 10"  (1.778 m)  Wt 198 lb (89.812 kg)  BMI 28.41 kg/m2 CONSTITUTIONAL: Well-developed, well-nourished female in no acute distress.  HENT:  Normocephalic, atraumatic, External right and left ear normal. Oropharynx is clear and moist EYES: Conjunctivae and EOM are normal. Pupils are equal, round, and reactive to light. No scleral icterus.  NECK: Normal range of motion, supple, no masses.  Normal thyroid.  SKIN: Skin is warm and dry. No rash noted. Not diaphoretic. No erythema. No pallor. [place]: Alert and oriented to person, place, and time. Normal reflexes, muscle tone coordination. No cranial nerve deficit noted. PSYCHIATRIC: Normal mood and affect. Normal behavior. Normal judgment and thought content. CARDIOVASCULAR: Normal heart rate noted,  regular rhythm RESPIRATORY: Clear to auscultation bilaterally. Effort and breath sounds normal, no problems with respiration noted. BREASTS: Symmetric in size. No masses, skin changes, nipple drainage, or lymphadenopathy. ABDOMEN: Soft, normal bowel sounds, no distention noted.  No tenderness, rebound or guarding.  PELVIC: Normal appearing external genitalia; normal appearing vaginal mucosa and cervix.  Mirena IUD strings visualized about 3 cm in length outside cervix. No abnormal discharge noted.  Pap smear obtained.  Normal uterine size, no other palpable masses, no uterine or adnexal tenderness. MUSCULOSKELETAL: Normal range of motion. No tenderness.  No cyanosis, clubbing, or edema.  2+ distal pulses.   Assessment:  Annual gynecologic examination with pap smear Mirena IUD surveillance   Plan:  Will follow up results of pap smear and manage accordingly. Mirena IUD in place, wants to continue for another 1.5 years (7 years) and she was told this was acceptable as per recent studies, usage in [place] etc. Was also told this was outside FDA guidelines. She is not worried about contraception efficacy given her PCOS; needed Metformin to conceive last time. Routine preventative health maintenance measures emphasized. Please refer to After Visit Summary for other counseling recommendations.    Verita Schneiders, MD, [place] Attending Obstetrician & Gynecologist, [place] for [facility]

## 2015-07-30 LAB — CYTOLOGY - PAP

## 2015-08-01 ENCOUNTER — Other Ambulatory Visit (HOSPITAL_COMMUNITY): Payer: BC Managed Care – PPO | Attending: Psychiatry | Admitting: Psychiatry

## 2015-08-01 ENCOUNTER — Encounter (HOSPITAL_COMMUNITY): Payer: Self-pay | Admitting: Psychiatry

## 2015-08-01 VITALS — BP 121/76 | HR 68 | Resp 12 | Ht 70.0 in | Wt 198.2 lb

## 2015-08-01 DIAGNOSIS — F332 Major depressive disorder, recurrent severe without psychotic features: Secondary | ICD-10-CM | POA: Insufficient documentation

## 2015-08-01 DIAGNOSIS — D1802 Hemangioma of intracranial structures: Secondary | ICD-10-CM | POA: Insufficient documentation

## 2015-08-01 DIAGNOSIS — F410 Panic disorder [episodic paroxysmal anxiety] without agoraphobia: Secondary | ICD-10-CM | POA: Diagnosis not present

## 2015-08-01 DIAGNOSIS — F333 Major depressive disorder, recurrent, severe with psychotic symptoms: Secondary | ICD-10-CM | POA: Insufficient documentation

## 2015-08-01 DIAGNOSIS — X789XXD Intentional self-harm by unspecified sharp object, subsequent encounter: Secondary | ICD-10-CM | POA: Insufficient documentation

## 2015-08-01 DIAGNOSIS — Z6372 Alcoholism and drug addiction in family: Secondary | ICD-10-CM | POA: Insufficient documentation

## 2015-08-01 DIAGNOSIS — G43901 Migraine, unspecified, not intractable, with status migrainosus: Secondary | ICD-10-CM | POA: Insufficient documentation

## 2015-08-01 DIAGNOSIS — G43011 Migraine without aura, intractable, with status migrainosus: Secondary | ICD-10-CM

## 2015-08-01 DIAGNOSIS — F17210 Nicotine dependence, cigarettes, uncomplicated: Secondary | ICD-10-CM | POA: Diagnosis not present

## 2015-08-01 DIAGNOSIS — Z8742 Personal history of other diseases of the female genital tract: Secondary | ICD-10-CM | POA: Insufficient documentation

## 2015-08-01 DIAGNOSIS — Z811 Family history of alcohol abuse and dependence: Secondary | ICD-10-CM | POA: Insufficient documentation

## 2015-08-01 DIAGNOSIS — F411 Generalized anxiety disorder: Secondary | ICD-10-CM | POA: Diagnosis not present

## 2015-08-01 DIAGNOSIS — Y92009 Unspecified place in unspecified non-institutional (private) residence as the place of occurrence of the external cause: Secondary | ICD-10-CM | POA: Insufficient documentation

## 2015-08-01 HISTORY — DX: Hemangioma of intracranial structures: D18.02

## 2015-08-01 NOTE — Progress Notes (Signed)
Psychiatric Initial Adult Assessment   Patient Identification: Heather Gardner MRN:  [id number] Date of Evaluation:  08/01/2015 Referral Source: [name] NP Tomasita Crum[facility] office Subjective " Every 7 or so years I break and I cant function;my coping skills fail me;this time is particularly scary because I have a child (35 yo son)." Chief Complaint:   Chief Complaint    Depression; Anxiety; Panic Attack; Stress; Family Problem     Visit Diagnosis:    ICD-9-CM ICD-10-CM   1. Severe episode of recurrent major depressive disorder, without psychotic features ([place]) 296.33 F33.2   2. Panic disorder 300.01 F41.0   3. 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 Intentional self-harm by sharp object in home as place of occurrence, subsequent encounter ([place]) IMO0001 X78.9XXD   5. Intractable migraine without aura and with status migrainosus 346.13 G43.011   6. Dysfunctional family due to alcoholism V61.41 Z63.72   7. Family history of alcoholism in father V61.41 Z37.72   52. Family history of alcoholism in mother V61.41 Z41.72   9. Intracranial cavernous hemangioma (HCC) 228.02 D18.02    Diagnosis:   Patient Active Problem List   Diagnosis Date Noted  . MDD (major depressive disorder), recurrent episode, severe ([place]) [F33.2] 08/01/2015  . Panic type anxiety neurosis [F40.02] 08/01/2015  . Intentional self-harm by sharp object in home as place of occurrence, subsequent encounter ([place]) [X78.9XXD] 08/01/2015  . Migraine with status migrainosus [G43.901] 08/01/2015  . Dysfunctional family due to alcoholism [Z63.72] 08/01/2015  . Family history of alcoholism in father [Z63.72] 08/01/2015  . Family history of alcoholism in mother [Z63.72] 08/01/2015  . Intracranial cavernous hemangioma (HCC) [D18.02] 08/01/2015   History of Present Illness:  Pt reports onset 7 yr cycle of Depression tiggered by onset of anxiety from being overwhelmed in November of 2016.Had been on Prozac since last episode  but PCP changer her to Lexapro with incomplete response and referred her to [facility] office in December where she began counseling and medication management .She hasnt worked since December.In early January she has her 3rd lifetime episode of d self cutting to relieve stress. She says her anxiety hasnt improved while her depression subjectively seems better her scoring on PHQ 9 is 22/severe depression. She c/o fatigue and not wanting to take so many medications for Migraine and depression as these seem to add to her tiredness.She was hospitalized in [place] during last cycle 7 years ago.  Associated Signs/Symptoms: Partner is Alcoholic now 11 days sober in [place]; Pt's parents are alcoholic-Father in recovery over 3 years;mother not in recovery-Pt has [employer] experience Pt is moving with son this weekend as she and partner seek stable home enviornment for son and repairing their relationship Severe bouts of Migraine's have pt worried about her Hemangiomas especially since MRI in March was unenhanced-she hasnt discussed with Neurologist "I dont do a very good job of advocating for myself"  Depression Symptoms:  depressed mood, anhedonia, psychomotor retardation, feelings of worthlessness/guilt, difficulty concentrating, anxiety, panic attacks, loss of energy/fatigue, disturbed sleep, (Hypo) Manic Symptoms:  None reported-never dx'd Bipolar Anxiety Symptoms:  Agoraphobia, Excessive Worry, Panic Symptoms, Social Anxiety, Psychotic Symptoms:  None PTSD Symptoms: Had a traumatic exposure:  Raised infamily where both parents alcoholic  Past Medical History:  Past Medical History  Diagnosis Date  . PCOS (polycystic ovarian syndrome)   . Kidney infection   . Headache   . Major depression ([place])   . Anxiety    History reviewed. No pertinent past surgical  history. Family History:  Family History  Problem Relation Age of Onset  . Heart disease Mother   . Cancer Mother     skin   . Alcohol abuse Mother   . Depression Mother   . Prostate cancer Father   . Throat cancer Father   . Cancer Father     skin  . Alcohol abuse Father   . Drug abuse Father   . Depression Father   . Kidney failure Maternal Grandmother   . Brain cancer Maternal Grandfather   . Lung cancer Maternal Grandfather   . Autism Son   . Bipolar disorder Sister   . Anxiety disorder Brother   . Anxiety disorder Paternal Grandmother    Social History:   Social History   Social History  . Marital Status: Married    Spouse Name: N/A  . Number of Children: 1  . Years of Education: N/A   Occupational History  . [employer]    Social History Main Topics  . Smoking status: Current Some Day Smoker -- 0.25 packs/day    Types: Cigarettes  . Smokeless tobacco: None  . Alcohol Use: No  . Drug Use: No  . Sexual Activity: Yes    Birth Control/ Protection: IUD   Other Topics Concern  . None   Social History Narrative   Additional Social History: See CCA  Musculoskeletal: Strength & Muscle Tone: within normal limits Gait & Station: normal Patient leans: N/A  Psychiatric Specialty Exam: HPI see above  Review of Systems  Constitutional: Negative for fever, chills, weight loss, malaise/fatigue and diaphoresis.  HENT: Negative for congestion, ear discharge, ear pain, hearing loss, nosebleeds, sore throat and tinnitus.   Eyes: Negative for blurred vision, double vision, photophobia, pain, discharge and redness.       Wears glasses  Respiratory: Negative for cough, hemoptysis, sputum production, shortness of breath, wheezing and stridor.   Cardiovascular: Negative for chest pain, palpitations, orthopnea, claudication, leg swelling and PND.  Gastrointestinal: Negative for heartburn, nausea, vomiting, abdominal pain, diarrhea, constipation, blood in stool and melena.  Genitourinary: Negative for dysuria, urgency, frequency, hematuria and flank pain.       PCOS  Musculoskeletal:  Negative for myalgias, back pain, joint pain, falls and neck pain.  Neurological: Positive for tingling, sensory change and headaches. Negative for dizziness, tremors, speech change, focal weakness, seizures, loss of consciousness and weakness.       HX of HA/Migraines and Hemangiomas  Endo/Heme/Allergies: Negative for environmental allergies and polydipsia. Does not bruise/bleed easily.  Psychiatric/Behavioral: Positive for depression. Negative for suicidal ideas, hallucinations, memory loss and substance abuse. The patient is nervous/anxious and has insomnia.     Blood pressure 121/76, pulse 68, resp. rate 12, height 5\' 10"  (1.778 m), weight 198 lb 3.2 oz (89.903 kg).Body mass index is 28.44 kg/(m^2).  General Appearance: Well Groomed  Eye Contact:  Good  Speech:  Clear and Coherent  Volume:  Decreased  Mood:  Dysphoric Tearful at times  Affect:  Congruent  Thought Process:  Coherent and Logical  Orientation:  Full (Time, Place, and Person)  Thought Content:  WDL and Rumination  Suicidal Thoughts:  No  Homicidal Thoughts:  No  Memory:  Negative  Judgement:  Impaired  Insight:  Lacking  Psychomotor Activity:  Decreased  Concentration:  Intact at visit  Recall:  Good  Fund of Knowledge:Good  Language: Good  Akathisia:  Negative  Handed:  Right  AIMS (if indicated):  na  Assets:  Communication  Skills Desire for Improvement Financial Resources/Insurance Housing Resilience Social Support Talents/Skills Transportation Vocational/Educational  ADL's:  Intact  Cognition: Impaired,  Moderate  Sleep: Trouble falling asleep   Is the patient at risk to self?  No. Has the patient been a risk to self in the past 6 months?  No. Has the patient been a risk to self within the distant past?  Yes.   Is the patient a risk to others?  No. Has the patient been a risk to others in the past 6 months?  No. Has the patient been a risk to others within the distant past?  No.  Allergies:    Allergies  Allergen Reactions  . Asa [Aspirin] Other (See Comments)    Patient stated that she can not take any blood thinners   Current Medications: Current Outpatient Prescriptions  Medication Sig Dispense Refill  . busPIRone (BUSPAR) 15 MG tablet Take 15 mg by mouth daily.    . clonazePAM (KLONOPIN) 0.5 MG tablet Take 0.5 mg by mouth 2 (two) times daily as needed for anxiety.    . gabapentin (NEURONTIN) 300 MG capsule Take 1 capsule (300 mg total) by mouth 3 (three) times daily. (Patient taking differently: Take 300 mg by mouth 3 (three) times daily. Updated:  100 mg tab; take three times a day) 90 capsule 6  . indomethacin (INDOCIN) 50 MG capsule Take 1 capsule (50 mg total) by mouth 2 (two) times daily as needed. 30 capsule 3  . levonorgestrel (MIRENA) 20 MCG/24HR IUD 1 each by Intrauterine route continuous.    . ondansetron (ZOFRAN) 4 MG tablet     . rizatriptan (MAXALT) 10 MG tablet Reported on 07/26/2015    . Topiramate ER 100 MG CP24 Take 100 mg by mouth at bedtime. 30 capsule 6  . traMADol (ULTRAM) 50 MG tablet Reported on 07/26/2015     No current facility-administered medications for this visit.    Previous Psychotropic Medications: Yes   Substance Abuse History in the last 12 months:  No.  

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
## 2015-08-02 ENCOUNTER — Other Ambulatory Visit (HOSPITAL_COMMUNITY): Payer: BC Managed Care – PPO | Admitting: Psychiatry

## 2015-08-02 DIAGNOSIS — F332 Major depressive disorder, recurrent severe without psychotic features: Secondary | ICD-10-CM

## 2015-08-02 NOTE — Progress Notes (Signed)
    Daily Group Progress Note  Program: IOP  Group Time: 9:00-10:30  Participation Level: Active  Behavioral Response: Appropriate  Type of Therapy:  Group Therapy  Summary of Progress: Pt. Presented as quiet and primarily flat affect. Pt. Listened to other group members and engaged in group in process.     Group Time: 10:30-12:00  Participation Level:  Active  Behavioral Response: Appropriate  Type of Therapy: Psycho-education Group  Summary of Progress: Pt. Participated in instruction of grounding sequence, breathing exercises, and countering cognitive distortions.  Nancie Neas, LPC

## 2015-08-03 ENCOUNTER — Other Ambulatory Visit (HOSPITAL_COMMUNITY): Payer: BC Managed Care – PPO | Admitting: Psychiatry

## 2015-08-03 DIAGNOSIS — F332 Major depressive disorder, recurrent severe without psychotic features: Secondary | ICD-10-CM | POA: Diagnosis not present

## 2015-08-03 NOTE — Progress Notes (Signed)
    Daily Group Progress Note  Program: IOP  Group Time: 9:00-10:30  Participation Level: Active  Behavioral Response: Appropriate  Type of Therapy:  Group Therapy  Summary of Progress: Pt. Presented as engaged in the group process, talkative, makes appropriate eye contact. Pt. Shared her story of mothering 35 year old son who is autistic, history of sleep deprivation, and recently making decision to move out of her home due to her partner's alcohol dependency and recovery.     Group Time: 10:30-12:00  Participation Level:  Active  Behavioral Response: Appropriate  Type of Therapy: Psycho-education Group  Summary of Progress: Pt. Participated in breath work and guided meditation and body scan.  Nancie Neas, LPC

## 2015-08-03 NOTE — Progress Notes (Signed)
    Daily Group Progress Note  Program: IOP  Group Time: 9:00-10:30  Participation Level: Active  Behavioral Response: Appropriate  Type of Therapy:  Group Therapy  Summary of Progress: Pt. Presents with flat affect, depressed. Pt. Is supportive of other group members, at times talkative. Pt. Is grieving loss of home life and relationship with partner due to needing recovery for her depression and his alcohol dependence.     Group Time: 10:30-12:00  Participation Level:  Active  Behavioral Response: Appropriate  Type of Therapy: Psycho-education Group  Summary of Progress: Pt. Participated in grief and loss group with Jeanella Craze.  Eloise Levels, [facility]

## 2015-08-06 ENCOUNTER — Other Ambulatory Visit (HOSPITAL_COMMUNITY): Payer: BC Managed Care – PPO | Admitting: Psychiatry

## 2015-08-06 DIAGNOSIS — F332 Major depressive disorder, recurrent severe without psychotic features: Secondary | ICD-10-CM | POA: Diagnosis not present

## 2015-08-06 NOTE — Progress Notes (Signed)
    Daily Group Progress Note  Program: IOP  Group Time: 9:00-10:30  Participation Level: Active  Behavioral Response: Appropriate  Type of Therapy:  Psycho-education Group  Summary of Progress: Pt. Participated in medication management education group with [place].     Group Time: 10:30-12:00  Participation Level:  Active  Behavioral Response: Appropriate  Type of Therapy: Group Therapy  Summary of Progress: Pt. Presented as talkative, engaged in group process. Pt. Was challenged by earlier group that she found frustrating due to conflict in the room. Pt. Shared difficulty managing stress and communicating her needs to family, limited sources of emotional support, and challenge of finding providers that are sensitive and validating of her concerns.   Nancie Neas, LPC

## 2015-08-07 ENCOUNTER — Other Ambulatory Visit (HOSPITAL_COMMUNITY): Payer: BC Managed Care – PPO | Admitting: Psychiatry

## 2015-08-07 DIAGNOSIS — F332 Major depressive disorder, recurrent severe without psychotic features: Secondary | ICD-10-CM | POA: Diagnosis not present

## 2015-08-08 ENCOUNTER — Other Ambulatory Visit (HOSPITAL_COMMUNITY): Payer: BC Managed Care – PPO | Admitting: Psychiatry

## 2015-08-08 DIAGNOSIS — F332 Major depressive disorder, recurrent severe without psychotic features: Secondary | ICD-10-CM
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
## 2015-08-09 ENCOUNTER — Other Ambulatory Visit (HOSPITAL_COMMUNITY): Payer: BC Managed Care – PPO | Admitting: Psychiatry

## 2015-08-09 DIAGNOSIS — F332 Major depressive disorder, recurrent severe without psychotic features: Secondary | ICD-10-CM | POA: Diagnosis not present

## 2015-08-09 NOTE — Progress Notes (Signed)
    Daily Group Progress Note  Program: IOP  Group Time: 9:00-10:30  Participation Level: Active  Behavioral Response: Appropriate  Type of Therapy:  Group Therapy  Summary of Progress: Pt. Met with Dr. Lovena Le for part of group therapy. Pt. Discussed feeling hopeless regarding making progress with her depression. Pt. Discussed being child of alcohol dependent parents, taking on role of parent in her home, and difficulty of accessing positive memories from her childhood.      Group Time: 10:30-12:00  Participation Level:  Active  Behavioral Response: Appropriate  Type of Therapy: Psycho-education Group  Summary of Progress: Pt. Participated in instruction about the grounding series and the RAIN meditation method.  Nancie Neas, LPC

## 2015-08-09 NOTE — Progress Notes (Signed)
    Daily Group Progress Note  Program: IOP  Group Time: 9:00-10:30  Participation Level: Active  Behavioral Response: Appropriate  Type of Therapy:  Group Therapy  Summary of Progress: Pt. Presented as talkative, engaged in the group process. Pt. Provided support and resources for fellow patients who are challenged by poor financial management and independent housing. Resources from [place] were discussed. Pt. Participated in discussion about patterns of co-dependency, patterns of under-valuing and caring for self that are learned in families chemical dependent histories.       Group Time: 10:30-12:00  Participation Level:  Active  Behavioral Response: Appropriate  Type of Therapy: Group Therapy  Summary of Progress: Pt. Participated in discussion about patterns of control/manipulation and emotional abuse in relationships i.e., isolating from family and friends, gaslighting, financial dependence, and fear of abandonment.  Nancie Neas, LPC

## 2015-08-10 ENCOUNTER — Other Ambulatory Visit (HOSPITAL_COMMUNITY): Payer: BC Managed Care – PPO | Admitting: Psychiatry

## 2015-08-10 DIAGNOSIS — F332 Major depressive disorder, recurrent severe without psychotic features: Secondary | ICD-10-CM | POA: Diagnosis not present

## 2015-08-10 NOTE — Progress Notes (Signed)
    Daily Group Progress Note  Program: IOP  Group Time: 9:00-10:30  Participation Level: Active  Behavioral Response: Appropriate  Type of Therapy:  Group Therapy  Summary of Progress: Pt. Presented as talkative, smiled appropriately. Pt. Continues to report significant anxiety with the thought of returning to work. Pt. Has been challenged by lack of support from her human resources offices. Pt. Discussed possible change of lifestyle that will allow her to engage in adequate self-care and take care of her son.     Group Time:  10:30-12:00  Participation Level:  Active  Behavioral Response: Appropriate  Type of Therapy: Psycho-education Group  Summary of Progress: Pt. Participated in discussion about development of self and seeking support from others. Pt. Provides positive feedback for other group members about how to manage emotional challenges by assertively advocating for themselves.  Nancie Neas, LPC

## 2015-08-13 ENCOUNTER — Other Ambulatory Visit (HOSPITAL_COMMUNITY): Payer: BC Managed Care – PPO | Admitting: Psychiatry

## 2015-08-13 DIAGNOSIS — F332 Major depressive disorder, recurrent severe without psychotic features: Secondary | ICD-10-CM

## 2015-08-13 NOTE — Progress Notes (Signed)
    Daily Group Progress Note  Program: IOP  Group Time: 9:00-10:30  Participation Level: Active  Behavioral Response: Appropriate  Type of Therapy:  Psycho-education Group  Summary of Progress: Pt. Participated in medication management group with Jiles Garter.     Group Time: 10:30-12:00  Participation Level:  Active  Behavioral Response: Appropriate  Type of Therapy: Group Therapy  Summary of Progress: Pt. Presented as alert and engaged in the group process. Pt. Shared feedback for other group members regarding asking for and having emotional needs met in relationship.  Nancie Neas, LPC

## 2015-08-14 ENCOUNTER — Other Ambulatory Visit (HOSPITAL_COMMUNITY): Payer: BC Managed Care – PPO | Admitting: Psychiatry

## 2015-08-14 DIAGNOSIS — F332 Major depressive disorder, recurrent severe without psychotic features: Secondary | ICD-10-CM

## 2015-08-14 MED ORDER — ARIPIPRAZOLE 2 MG PO TABS
2.0000 mg | ORAL_TABLET | Freq: Every day | ORAL | Status: DC
Start: 2015-08-14 — End: 2016-01-02

## 2015-08-14 NOTE — Progress Notes (Signed)
    Daily Group Progress Note  Program: IOP  Group Time: 9:00-12:00  Participation Level: Active  Behavioral Response: Appropriate  Type of Therapy:  Group Therapy  Summary of Progress: Pt. Presented with primarily flat affect, at times tearful and sad, and lethargic. Pt. Provided meaningful feedback to other group members about destructive nature of excessive people pleasing and gaining tolerance for discomfort when not pleasing others. Pt. Participated in extended group therapy session processing member experiences with relationship boundaries, guilt and shame related to communicating personal needs for others, patterns of excessive caregiving and tendencies to "save" others in relationships.   Nancie Neas, LPC
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
## 2015-08-15 ENCOUNTER — Ambulatory Visit: Payer: BC Managed Care – PPO | Admitting: Diagnostic Neuroimaging

## 2015-08-15 ENCOUNTER — Telehealth (HOSPITAL_COMMUNITY): Payer: Self-pay | Admitting: Psychiatry

## 2015-08-15 ENCOUNTER — Other Ambulatory Visit (HOSPITAL_COMMUNITY): Payer: BC Managed Care – PPO | Admitting: Psychiatry

## 2015-08-16 ENCOUNTER — Other Ambulatory Visit (HOSPITAL_COMMUNITY): Payer: BC Managed Care – PPO | Admitting: Psychiatry

## 2015-08-16 ENCOUNTER — Encounter: Payer: Self-pay | Admitting: Diagnostic Neuroimaging

## 2015-08-16 DIAGNOSIS — F332 Major depressive disorder, recurrent severe without psychotic features: Secondary | ICD-10-CM

## 2015-08-17 ENCOUNTER — Other Ambulatory Visit (HOSPITAL_COMMUNITY): Payer: BC Managed Care – PPO | Admitting: Psychiatry

## 2015-08-17 DIAGNOSIS — F332 Major depressive disorder, recurrent severe without psychotic features: Secondary | ICD-10-CM | POA: Diagnosis not present

## 2015-08-17 NOTE — Progress Notes (Signed)
    Daily Group Progress Note  Program: IOP  Group Time: 9:00-10:30  Participation Level: Active  Behavioral Response: Appropriate  Type of Therapy:  Group Therapy  Summary of Progress: Pt. Presented with brightened affect, talkative, engaged in group process. Pt. Left a few minutes early to meet with there medication management.      Group Time: 10:30-12:00  Participation Level:  Active  Behavioral Response: Appropriate  Type of Therapy: Psycho-education Group  Summary of Progress: Pt. Participated in discussion about Al[place] Lint developing vulnerability to manage shame and avoidance.   Nancie Neas, LPC

## 2015-08-17 NOTE — Progress Notes (Signed)
    Daily Group Progress Note  Program: IOP  Group Time: 9:00-10:30  Participation Level: Active  Behavioral Response: Appropriate  Type of Therapy:  Group Therapy  Summary of Progress: Pt. Presented as anxious and talkative. Pt. Discussed getting new diagnosis of schizoaffective disorder and anxiety regarding return to work and feeling that she needs more time before being stable enough to return to work.     Group Time: 10:30-12:00  Participation Level:  Active  Behavioral Response: Appropriate  Type of Therapy: Psycho-education Group  Summary of Progress: Pt. Reported that her medication was causing her to feel very sleepy and lethargic and asked to be excused from group.  Nancie Neas, LPC

## 2015-08-20 ENCOUNTER — Other Ambulatory Visit (HOSPITAL_COMMUNITY): Payer: BC Managed Care – PPO | Admitting: Psychiatry

## 2015-08-20 DIAGNOSIS — F332 Major depressive disorder, recurrent severe without psychotic features: Secondary | ICD-10-CM | POA: Diagnosis not present

## 2015-08-21 ENCOUNTER — Other Ambulatory Visit (HOSPITAL_COMMUNITY): Payer: BC Managed Care – PPO | Admitting: Psychiatry

## 2015-08-21 DIAGNOSIS — F332 Major depressive disorder, recurrent severe without psychotic features: Secondary | ICD-10-CM | POA: Diagnosis not present

## 2015-08-22 ENCOUNTER — Encounter: Payer: Self-pay | Admitting: Diagnostic Neuroimaging

## 2015-08-22 ENCOUNTER — Other Ambulatory Visit (HOSPITAL_COMMUNITY): Payer: BC Managed Care – PPO | Admitting: Psychiatry

## 2015-08-22 ENCOUNTER — Ambulatory Visit (INDEPENDENT_AMBULATORY_CARE_PROVIDER_SITE_OTHER): Payer: BC Managed Care – PPO | Admitting: Diagnostic Neuroimaging

## 2015-08-22 VITALS — BP 104/66 | HR 76 | Ht 70.0 in | Wt 196.6 lb

## 2015-08-22 DIAGNOSIS — G43019 Migraine without aura, intractable, without status migrainosus: Secondary | ICD-10-CM | POA: Diagnosis not present

## 2015-08-22 DIAGNOSIS — R441 Visual hallucinations: Secondary | ICD-10-CM | POA: Diagnosis not present

## 2015-08-22 DIAGNOSIS — F332 Major depressive disorder, recurrent severe without psychotic features: Secondary | ICD-10-CM

## 2015-08-22 MED ORDER — INDOMETHACIN 50 MG PO CAPS: 50.0000 mg | ORAL_CAPSULE | Freq: Two times a day (BID) | ORAL | Status: DC | PRN

## 2015-08-22 MED ORDER — GABAPENTIN 300 MG PO CAPS: 300.0000 mg | ORAL_CAPSULE | Freq: Three times a day (TID) | ORAL | Status: DC

## 2015-08-22 MED ORDER — TOPIRAMATE ER 100 MG PO CAP24: 100.0000 mg | ORAL_CAPSULE | Freq: Every day | ORAL | Status: DC

## 2015-08-22 NOTE — Patient Instructions (Signed)
Patient completed MH-IOP today.  Will follow up with [name], NP and Dr. Tomi Bamberger .  Encouraged support groups.

## 2015-08-22 NOTE — Progress Notes (Signed)
    Daily Group Progress Note  Program: IOP  Group Time: 9:00-10:30  Participation Level: Active  Behavioral Response: Appropriate  Type of Therapy:  Group Therapy  Summary of Progress: Pt. Presents as calm and focused on problem solving return to work and identifying social support.     Group Time: 10:30-12:00  Participation Level:  Active  Behavioral Response: Appropriate  Type of Therapy: Psycho-education Group  Summary of Progress: Pt. Participated in discussion about finding appropriate social support. Pt. Discussed her plan to create a signup genius for her social circle so that they can alert each other of needs.  Eloise Levels, Ph.D., [facility]

## 2015-08-22 NOTE — Progress Notes (Signed)
Patient ID: [name], female   DOB: [birth year], 35 y.o.   MRN: [id number] Discharge Note  Patient:  Heather Gardner is an 35 y.o., female DOB:  July 06, 1980  Date of Admission:  (Not on file)  Date of Discharge:  08/22/2015  Reason for Admission:depression  IOP Course  Ms Hanahan attended and participated.  She remains depressed and believes she cannot return to work without further decompensation.  Group was helpful she said as people understood her and it was good to be around people.  She remains tearful and anxious ane requesting further time out from work,  Mental Status at [place], depressed, feeling overwhelmed if she has to return to work, but not suicidal  Lab Results: No results found for this or any previous visit (from the past 13 hour(s)).   Current outpatient prescriptions:  .  ARIPiprazole (ABILIFY) 2 MG tablet, Take 1 tablet (2 mg total) by mouth daily., Disp: 30 tablet, Rfl: 0 .  busPIRone (BUSPAR) 15 MG tablet, Take 15 mg by mouth daily., Disp: , Rfl:  .  clonazePAM (KLONOPIN) 0.5 MG tablet, Take 0.5 mg by mouth 2 (two) times daily as needed for anxiety., Disp: , Rfl:  .  gabapentin (NEURONTIN) 300 MG capsule, Take 1 capsule (300 mg total) by mouth 3 (three) times daily. (Patient taking differently: Take 300 mg by mouth 3 (three) times daily. Updated:  100 mg tab; take three times a day), Disp: 90 capsule, Rfl: 6 .  indomethacin (INDOCIN) 50 MG capsule, Take 1 capsule (50 mg total) by mouth 2 (two) times daily as needed., Disp: 30 capsule, Rfl: 3 .  levonorgestrel (MIRENA) 20 MCG/24HR IUD, 1 each by Intrauterine route continuous., Disp: , Rfl:  .  ondansetron (ZOFRAN) 4 MG tablet, , Disp: , Rfl:  .  rizatriptan (MAXALT) 10 MG tablet, Reported on 07/26/2015, Disp: , Rfl:  .  Topiramate ER 100 MG CP24, Take 100 mg by mouth at bedtime., Disp: 30 capsule, Rfl: 6 .  traMADol (ULTRAM) 50 MG tablet, Reported on 07/26/2015, Disp: , Rfl:   Axis Diagnosis:  Major  depression, recurrent, severe without psychosis   Level of Care:  IOP  Discharge destination:  Other:  has appointments with her provider and therapist  Is patient on multiple antipsychotic therapies at discharge:  No    Has Patient had three or more failed trials of antipsychotic monotherapy by history:  Negative  Patient phone:  [phone] (home)  Patient address:   [address] [id number],   Follow-up recommendations:  Activity:  continue current activity Diet:  continue current diet  Comments:  I would agree with further disability as I think she will decompensate if she returns to work but that will be up to her outpatient providers  The patient received suicide prevention pamphlet:  Yes   [name] 08/22/2015, 2:21 PM

## 2015-08-22 NOTE — Patient Instructions (Signed)
Thank you for coming to see [place] at [facility]. I hope we have been able to provide you high quality care today.  You may receive a patient satisfaction survey over the next few weeks. We would appreciate your feedback and comments so that we may continue to improve ourselves and the health of our patients.  - continue gabapentin 326m three times per day - continue topiramate ER 1069mat bedtime - continue indomethacin 5026ms needed for breakthrough headaches - monitor hallucinations; likely related to underlying mood disorder; may consider MRI if symptoms significantly worsen   ~~~~~~~~~~~~~~~~~~~~~~~~~~~~~~~~~~~~~~~~~~~~~~~~~~~~~~~~~~~~~~~~~  DR. Ezeriah Luty'S GUIDE TO HAPPY AND HEALTHY LIVING These are some of my general health and wellness recommendations. Some of them may apply to you better than others. Please use common sense as you try these suggestions and feel free to ask me any questions.   ACTIVITY/FITNESS Mental, social, emotional and physical stimulation are very important for brain and body health. Try learning a new activity (arts, music, language, sports, games).  Keep moving your body to the best of your abilities. You can do this at home, inside or outside, the park, community center, gym or anywhere you like. Consider a physical therapist or personal trainer to get started. Consider the app Sworkit. Fitness trackers such as smart-watches, smart-phones or Fitbits can help as well.   NUTRITION Eat more plants: colorful vegetables, nuts, seeds and berries.  Eat less sugar, salt, preservatives and processed foods.  Avoid toxins such as cigarettes and alcohol.  Drink water when you are thirsty. Warm water with a slice of lemon is an excellent morning drink to start the day.  Consider these websites for more information The Nutrition Source (htthttps://www.henry-hernandez.biz/recision Nutrition  (wwwWindowBlog.ch RELAXATION Consider practicing mindfulness meditation or other relaxation techniques such as deep breathing, prayer, yoga, tai chi, massage. See website mindful.org or the apps Headspace or Calm to help get started.   SLEEP Try to get at least 7-8+ hours sleep per day. Regular exercise and reduced caffeine will help you sleep better. Practice good sleep hygeine techniques. See website sleep.org for more information.   PLANNING Prepare estate planning, living will, healthcare POA documents. Sometimes this is best planned with the help of an attorney. Theconversationproject.org and agingwithdignity.org are excellent resources.

## 2015-08-22 NOTE — Progress Notes (Signed)
Heather Gardner is a 35 y.o. , divorced, employed, Caucasian female, who was referred per [name], NP; treatment for worsening depressive and anxiety symptoms. Pt admitted to daily panic attacks. Denied SI/HI. Voiced A/V hallucinations when increased anxiety during the night. Pt stated her symptoms date back to Spring 2016; whenever she started having "blinding migraines." Stated at that time she was purchasing a home and there was a financial strain because she had to be pulled out of work on [employer]. Stressor/Triggers: 1) 34 yr old Autistic son, with severe Asthma, Spasmodic Croup, and Intermittent Fever. According to pt, he perseverates on tolieting. "He will hold his urine and stool until it causes problems." 2) Female partner is an alcoholic and is 11 days in recovery. Pt states she and her son will be moving out of the home this weekend in order for she and boyfriend to work on their own recovery/wellness. Pt states she will be renting a home in [place], [place]. 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 Pt admits to conflict with the administrative staff. "They are not consistent and there's no back up with behavioral issues." 4) Medical: PCOS and Migraines.  Pt completed MH-IOP today.  Pt remains tearful at times.  Admits to continued struggle with A/V hallucinations.  Denies SI/HI.  Pt states that the program was helpful.  "It helped with a sense of belonging."  According to pt, she is aware of symptoms and patterns.  "I can see what I need to do; but I'm not ready to return to my regular schedule of working and being a full-time mother."  A:  D/C today.  Will follow up with Dr. Tomi Bamberger and [name], NP.  Pt is planning to attend [facility] [place] and other support groups.  Contacted [name], NP and left message for her to call writer back re:  RTW date.  R:  Pt receptive.    Carlis Abbott, RITA,  M.Ed, CNA

## 2015-08-22 NOTE — Progress Notes (Signed)
[facility]  PATIENT: Heather Gardner DOB: August 20, 1980  REFERRING CLINICIAN: [name], MD HISTORY FROM: patient  REASON FOR VISIT: follow up   HISTORICAL  CHIEF COMPLAINT:  Chief Complaint  Patient presents with  . Migraine    RM 7, "some degree of HA every day, severe HA 4-5 x month, general confusion, hallucinations-new problem"  . Follow-up    last seen 09/2014    HISTORY OF PRESENT ILLNESS:   UPDATE 08/22/15: Since last visit, having new issues --> since Dec 2016 having intermittent visual hallucinations (spiders, people, animals). Has completed IOP program and now on abilify. Tolerating TPX ER 100mg  daily and gabapentin 300mg  daily. Still with 4-6 days HA per month (missing work).   UPDATE 10/19/14: Since last visit, continues with intermittent HA (2 types as per last note). Tried TPX 50mg  BID without relief, then added gabapentin 300mg  daily (mild relief) and reduce TPX to 75mg  at bedtime. Occ uses rizatriptan, but saves it for severe HA only. Still missing up to 4 days of work per month. Some ipsilateral "heat" sensation with headache, but no lacrimation or nasal congestions. No caffeine or alcohol. She eats clean, few carbs, mainly veggies and protein. Sleeps 8-9 hrs per night. Lives with 1 child at home (who is autistic).  Reports history of venous angioma x 2 in the brain, dx'd many years ago.   PRIOR HPI (09/27/14): 35 year old right-handed female with polycystic ovarian syndrome, migraine, depression, here for evaluation of headaches. Patient referred for consultation by Dr. [name]. Patient developed migraine headaches in her 73s. She describes occipital, posterior, throbbing headaches with photophobia, phonophobia, nausea. Patient was able to control headaches in the past with over-the-counter medications and more recently with topiramate and Maxalt. Patient also has had nocturnal vertex headaches with severe vomiting, shaking sensation, slurred speech,  burning sensation on the top of her head. Headaches improved in her late 47s and early 25s. Over past 1-2 months headaches have become more severe. She has missed 8 days of work recently due to severe headaches. She is again having 2 types of headaches. The more troublesome headaches are the nocturnal burning severe headaches. No specific triggering factors. Headaches seem to be worse in the springtime. She has been on antinausea medications, tramadol, ibuprofen and Tylenol without relief.   REVIEW OF SYSTEMS: Full 14 system review of systems performed and notable only for agitation confusion depression anxiety hallucinations memory loss dizziness light sens ear pain.   ALLERGIES: Allergies  Allergen Reactions  . Asa [Aspirin] Other (See Comments)    Patient stated that she can not take any blood thinners    HOME MEDICATIONS: Outpatient Prescriptions Prior to Visit  Medication Sig Dispense Refill  . ARIPiprazole (ABILIFY) 2 MG tablet Take 1 tablet (2 mg total) by mouth daily. 30 tablet 0  . busPIRone (BUSPAR) 15 MG tablet Take 15 mg by mouth daily.    . clonazePAM (KLONOPIN) 0.5 MG tablet Take 0.5 mg by mouth 2 (two) times daily as needed for anxiety.    . gabapentin (NEURONTIN) 300 MG capsule Take 1 capsule (300 mg total) by mouth 3 (three) times daily. (Patient taking differently: Take 300 mg by mouth 3 (three) times daily. Updated:  100 mg tab; take three times a day) 90 capsule 6  . indomethacin (INDOCIN) 50 MG capsule Take 1 capsule (50 mg total) by mouth 2 (two) times daily as needed. 30 capsule 3  . levonorgestrel (MIRENA) 20 MCG/24HR IUD 1 each by Intrauterine route continuous.    Marland Kitchen  ondansetron (ZOFRAN) 4 MG tablet     . Topiramate ER 100 MG CP24 Take 100 mg by mouth at bedtime. 30 capsule 6  . rizatriptan (MAXALT) 10 MG tablet Reported on 07/26/2015    . traMADol (ULTRAM) 50 MG tablet Reported on 07/26/2015     No facility-administered medications prior to visit.    PAST MEDICAL  HISTORY: Past Medical History  Diagnosis Date  . PCOS (polycystic ovarian syndrome)   . Kidney infection   . Headache   . Major depression ([place])   . Anxiety     PAST SURGICAL HISTORY: History reviewed. No pertinent past surgical history.  FAMILY HISTORY: Family History  Problem Relation Age of Onset  . Heart disease Mother   . Cancer Mother     skin  . Alcohol abuse Mother   . Depression Mother   . Prostate cancer Father   . Throat cancer Father   . Cancer Father     skin  . Alcohol abuse Father   . Drug abuse Father   . Depression Father   . Kidney failure Maternal Grandmother   . Brain cancer Maternal Grandfather   . Lung cancer Maternal Grandfather   . Autism Son   . Bipolar disorder Sister   . Anxiety disorder Brother   . Anxiety disorder Paternal Grandmother     SOCIAL HISTORY:  Social History   Social History  . Marital Status: Married    Spouse Name: N/A  . Number of Children: 1  . Years of Education: N/A   Occupational History  . [employer]    Social History Main Topics  . Smoking status: Current Some Day Smoker -- 0.25 packs/day    Types: Cigarettes  . Smokeless tobacco: Not on file     Comment: 08/22/15 smoke when stressed  . Alcohol Use: No  . Drug Use: No  . Sexual Activity: Yes    Birth Control/ Protection: IUD   Other Topics Concern  . Not on file   Social History Narrative     PHYSICAL EXAM  Filed Vitals:   08/22/15 1529  BP: 104/66  Pulse: 76  Height: 5\' 10"  (1.778 m)  Weight: 196 lb 9.6 oz (89.177 kg)    Body mass index is 28.21 kg/(m^2).  No exam data present  No flowsheet data found.  GENERAL EXAM: Patient is in no distress; well developed, nourished and groomed; neck is supple  CARDIOVASCULAR: Regular rate and rhythm, no murmurs, no carotid bruits  NEUROLOGIC: MENTAL STATUS: awake, alert, language fluent, comprehension intact, naming intact, fund of knowledge appropriate CRANIAL NERVE: no  papilledema on fundoscopic exam, pupils equal and reactive to light, visual fields full to confrontation, extraocular muscles intact, no nystagmus, facial sensation and strength symmetric, hearing intact, palate elevates symmetrically, uvula midline, shoulder shrug symmetric, tongue midline. MOTOR: normal bulk and tone, full strength in the BUE, BLE SENSORY: normal and symmetric to light touch  COORDINATION: finger-nose-finger, fine finger movements normal REFLEXES: deep tendon reflexes present and symmetric GAIT/STATION: narrow based gait; romberg is negative    DIAGNOSTIC DATA (LABS, IMAGING, TESTING) - I reviewed patient records, labs, notes, testing and imaging myself where available.  Lab Results  Component Value Date   WBC 6.9 05/10/2012   HGB 14.2 05/10/2012   HCT 41.1 05/10/2012   MCV 84.4 05/10/2012   PLT 223 05/10/2012      Component Value Date/Time   NA 135 05/10/2012 0230   K 3.4* 05/10/2012 0230   CL  101 05/10/2012 0230   CO2 24 05/10/2012 0230   GLUCOSE 80 05/10/2012 0230   BUN 13 05/10/2012 0230   CREATININE 0.56 05/10/2012 0230   CALCIUM 9.3 05/10/2012 0230   PROT 7.4 05/10/2012 0230   ALBUMIN 3.9 05/10/2012 0230   AST 22 05/10/2012 0230   ALT 25 05/10/2012 0230   ALKPHOS 68 05/10/2012 0230   BILITOT 0.3 05/10/2012 0230   GFRNONAA >90 05/10/2012 0230   GFRAA >90 05/10/2012 0230   No results found for: CHOL, HDL, LDLCALC, LDLDIRECT, TRIG, CHOLHDL No results found for: HGBA1C No results found for: VITAMINB12 No results found for: TSH  March 2016 MRI brain ([facility]) - normal per referring MD notes    ASSESSMENT AND PLAN  35 y.o. year old female here with history of migraine since 2000's, now with increasing headaches. Normal neuro exam and MRI brain. 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   Dx:  Intractable migraine without aura and without status  migrainosus  Visual hallucination     PLAN: - continue gabapentin 300mg  three times per day - continue topiramate ER 100mg  at bedtime - continue indomethacin 50mg  as needed for breakthrough headaches - monitor hallucinations; likely related to underlying mood disorder; may consider MRI if sxs sig worsen  Meds ordered this encounter  Medications  . Topiramate ER 100 MG CP24    Sig: Take 100 mg by mouth at bedtime.    Dispense:  90 capsule    Refill:  4  . gabapentin (NEURONTIN) 300 MG capsule    Sig: Take 1 capsule (300 mg total) by mouth 3 (three) times daily.    Dispense:  270 capsule    Refill:  4  . indomethacin (INDOCIN) 50 MG capsule    Sig: Take 1 capsule (50 mg total) by mouth 2 (two) times daily as needed.    Dispense:  60 capsule    Refill:  6   Return in about 3 months (around 11/19/2015).    Penni Bombard, MD [id number], [id number] PM Certified in Neurology, Neurophysiology and Neuroimaging  [facility] [address] [place], [place] [address] [phone]

## 2015-08-22 NOTE — Progress Notes (Signed)
    Daily Group Progress Note  Program: IOP  Group Time: 9:00-10:30  Participation Level: Active  Behavioral Response: Appropriate  Type of Therapy:  Psycho-education Group  Summary of Progress: Pt. Participated in medication education group with Elena.     Group Time: 10:30-12:00  Participation Level:  Active  Behavioral Response: Appropriate  Type of Therapy: Group Therapy  Summary of Progress: Pt. Participated in discussion about childhood wounds and how the needs to have childhood needs met follows into adult relationships.  Brown, Jennifer B, LPC 

## 2015-08-23 NOTE — Progress Notes (Signed)
    Daily Group Progress Note  Program: IOP  Group Time: 9:00-12:00  Participation Level: Active  Behavioral Response: Appropriate  Type of Therapy:  Group Therapy  Summary of Progress: Pt. Prepared for discharge. Pt. Discussed planning for return to work and hoping to get signed out by her medication management provider for total of sixty days so that she can plan to return to school in the fall. Pt. Shared self identity homework with the group i.e., compassionate mother, nurturing, creative, enjoys books and silence, and believes that every person deserves respect. The group provided feedback that they found her honest, passionate, and desiring a good life for others.       Nancie Neas, LPC

## 2015-08-29 HISTORY — PX: WISDOM TOOTH EXTRACTION: SHX21

## 2015-08-30 ENCOUNTER — Telehealth: Payer: Self-pay | Admitting: Diagnostic Neuroimaging

## 2015-08-30 NOTE — Telephone Encounter (Signed)
Patient is calling to discuss clarfication on migraines and hallucinations. Patient says she will be having oral surgery tomorrow so a call back next Monday will be fine if unable to call today.

## 2015-08-31 NOTE — Telephone Encounter (Signed)
LVM requesting call back.

## 2015-09-03 ENCOUNTER — Encounter: Payer: Self-pay | Admitting: Diagnostic Neuroimaging

## 2015-09-03 ENCOUNTER — Encounter: Payer: Self-pay | Admitting: *Deleted

## 2015-09-03 NOTE — Telephone Encounter (Addendum)
Spoke with patient who stated she is having to switch psychiatrists, so she needs provider to write her out of work until she establishes a new psychiatrist. She stated she is missing work due to ongoing anxiety, pain, and difficulty sleeping. She stated her PCP "does not feel comfortable writing letter because she has not seen patient in a long time".  She stated that all her job will require is letter, not FMLA papers. Informed her would draft letter for Dr Gladstone Lighter review after he returns ot work tomorrow. Advised that it may be a few days before letter is ready. Advised there is a fee for letters. She requested to be notified when letter ready, verbalized understanding, appreciation.

## 2015-09-04 NOTE — Telephone Encounter (Signed)
Spoke with patient and informed her that her letter is ready for pick up at front desk.  She is aware of fee for letter. She verbalized understanding, appreciation.

## 2015-09-05 DIAGNOSIS — Z0289 Encounter for other administrative examinations: Secondary | ICD-10-CM

## 2015-09-27 ENCOUNTER — Telehealth: Payer: Self-pay | Admitting: *Deleted

## 2015-09-27 NOTE — Telephone Encounter (Signed)
Patient short term disability   form on [facility] desk.

## 2015-09-28 DIAGNOSIS — Z0289 Encounter for other administrative examinations: Secondary | ICD-10-CM

## 2015-09-28 LAB — GLUCOSE, POCT (MANUAL RESULT ENTRY): POC GLUCOSE: 74 mg/dL (ref 70–99)

## 2015-10-01 ENCOUNTER — Telehealth: Payer: Self-pay | Admitting: *Deleted

## 2015-10-01 NOTE — Telephone Encounter (Signed)
Patient form ready for pick up at the front desk.

## 2015-10-09 ENCOUNTER — Telehealth: Payer: Self-pay | Admitting: Diagnostic Neuroimaging

## 2015-10-09 NOTE — Telephone Encounter (Signed)
Received call back from patient; informed her that she may come tomorrow for migraine infusion, per Dr Leta Baptist. Advised she will need a driver if receiving medication for nausea; she stated she will have driver, requested to come at 11:30 am. Will inform Tina, infusion RN in morning. Patient verbalized understanding, appreciation.

## 2015-10-09 NOTE — Telephone Encounter (Signed)
Patient called to request infusion appointment tomorrow, please call [phone].

## 2015-10-09 NOTE — Telephone Encounter (Signed)
LVM requesting call back to schedule infusion tomorrow.

## 2015-10-18 ENCOUNTER — Telehealth: Payer: Self-pay | Admitting: *Deleted

## 2015-10-18 ENCOUNTER — Telehealth: Payer: Self-pay | Admitting: Diagnostic Neuroimaging

## 2015-10-18 DIAGNOSIS — R441 Visual hallucinations: Secondary | ICD-10-CM

## 2015-10-18 DIAGNOSIS — D1802 Hemangioma of intracranial structures: Secondary | ICD-10-CM

## 2015-10-18 DIAGNOSIS — R9089 Other abnormal findings on diagnostic imaging of central nervous system: Secondary | ICD-10-CM

## 2015-10-18 NOTE — Telephone Encounter (Signed)
Pt called sts at last OV Dr Leta Baptist said if she and psychiatrist felt she needed MRI for angioma to call back and he would refer her for this. She said the hallucinations are getting worse and the medication is not helping. She said the MRI needs to be with contrast per the psychiatrist.

## 2015-10-18 NOTE — Telephone Encounter (Signed)
Topiramate ER 100 mg PA [id number], authorized from 10/18/15 through 09/2016.

## 2015-10-19 NOTE — Telephone Encounter (Signed)
Spoke with patient and informed her MRI order has been placed, and she will get call to schedule. She verbalized understanding. She then stated that her PCP gave her refill for Zofran, med record updated.

## 2015-10-19 NOTE — Telephone Encounter (Signed)
I ordered MRI brain. -VRP

## 2015-10-26 ENCOUNTER — Ambulatory Visit
Admission: RE | Admit: 2015-10-26 | Discharge: 2015-10-26 | Disposition: A | Discharge status: Home / Self Care | Payer: Medicaid Other | Source: Ambulatory Visit | Attending: Diagnostic Neuroimaging | Admitting: Diagnostic Neuroimaging

## 2015-10-26 DIAGNOSIS — R441 Visual hallucinations: Secondary | ICD-10-CM

## 2015-10-26 DIAGNOSIS — D1802 Hemangioma of intracranial structures: Secondary | ICD-10-CM | POA: Diagnosis not present

## 2015-10-26 MED ORDER — GADOBENATE DIMEGLUMINE 529 MG/ML IV SOLN
20.0000 mL | Freq: Once | INTRAVENOUS | Status: AC | PRN
  Administered 2015-10-26: 20 mL via INTRAVENOUS

## 2015-10-29 ENCOUNTER — Telehealth: Payer: Self-pay | Admitting: *Deleted

## 2015-10-29 NOTE — Telephone Encounter (Signed)
Dr Leta Baptist spoke with patient and gave results. Will see her back in follow up.

## 2015-11-19 ENCOUNTER — Ambulatory Visit (INDEPENDENT_AMBULATORY_CARE_PROVIDER_SITE_OTHER): Payer: BLUE CROSS/BLUE SHIELD | Admitting: Diagnostic Neuroimaging

## 2015-11-19 ENCOUNTER — Encounter: Payer: Self-pay | Admitting: Diagnostic Neuroimaging

## 2015-11-19 VITALS — BP 102/70 | HR 80 | Ht 70.0 in | Wt 211.6 lb

## 2015-11-19 DIAGNOSIS — R441 Visual hallucinations: Secondary | ICD-10-CM | POA: Diagnosis not present

## 2015-11-19 DIAGNOSIS — G43019 Migraine without aura, intractable, without status migrainosus: Secondary | ICD-10-CM | POA: Diagnosis not present

## 2015-11-19 DIAGNOSIS — G4719 Other hypersomnia: Secondary | ICD-10-CM | POA: Diagnosis not present

## 2015-11-19 DIAGNOSIS — R0683 Snoring: Secondary | ICD-10-CM | POA: Diagnosis not present

## 2015-11-19 MED ORDER — TOPIRAMATE ER 100 MG PO CAP24: 100.0000 mg | ORAL_CAPSULE | Freq: Every day | ORAL | Status: DC

## 2015-11-19 MED ORDER — GABAPENTIN 300 MG PO CAPS: 300.0000 mg | ORAL_CAPSULE | Freq: Three times a day (TID) | ORAL | Status: DC

## 2015-11-19 NOTE — Patient Instructions (Addendum)
Thank you for coming to see [place] at [facility]. I hope we have been able to provide you high quality care today.  You may receive a patient satisfaction survey over the next few weeks. We would appreciate your feedback and comments so that we may continue to improve ourselves and the health of our patients.  - continue topiramate and gabapentin - consider sleep study   ~~~~~~~~~~~~~~~~~~~~~~~~~~~~~~~~~~~~~~~~~~~~~~~~~~~~~~~~~~~~~~~~~  DR. Abdoulie Tierce'S GUIDE TO HAPPY AND HEALTHY LIVING These are some of my general health and wellness recommendations. Some of them may apply to you better than others. Please use common sense as you try these suggestions and feel free to ask me any questions.   ACTIVITY/FITNESS Mental, social, emotional and physical stimulation are very important for brain and body health. Try learning a new activity (arts, music, language, sports, games).  Keep moving your body to the best of your abilities. You can do this at home, inside or outside, the park, community center, gym or anywhere you like. Consider a physical therapist or personal trainer to get started. Consider the app Sworkit. Fitness trackers such as smart-watches, smart-phones or Fitbits can help as well.   NUTRITION Eat more plants: colorful vegetables, nuts, seeds and berries.  Eat less sugar, salt, preservatives and processed foods.  Avoid toxins such as cigarettes and alcohol.  Drink water when you are thirsty. Warm water with a slice of lemon is an excellent morning drink to start the day.  Consider these websites for more information The Nutrition Source (https://www.henry-hernandez.biz/) Precision Nutrition (WindowBlog.ch)   RELAXATION Consider practicing mindfulness meditation or other relaxation techniques such as deep breathing, prayer, yoga, tai chi, massage. See website mindful.org or the apps Headspace or Calm to help get  started.   SLEEP Try to get at least 7-8+ hours sleep per day. Regular exercise and reduced caffeine will help you sleep better. Practice good sleep hygeine techniques. See website sleep.org for more information.   PLANNING Prepare estate planning, living will, healthcare POA documents. Sometimes this is best planned with the help of an attorney. Theconversationproject.org and agingwithdignity.org are excellent resources.

## 2015-11-19 NOTE — Progress Notes (Signed)
[facility]  PATIENT: Heather Gardner DOB: [birth year]  REFERRING CLINICIAN: [name], MD HISTORY FROM: patient  REASON FOR VISIT: follow up   HISTORICAL  CHIEF COMPLAINT:  Chief Complaint  Patient presents with  . Migraine    rm 7, "migraines getting worse, wake me at night, cycles-sometimes gets better; HA almost daily; stopped Indocin-not heling; hallucinations not better'  . Follow-up    3 month    HISTORY OF PRESENT ILLNESS:   UPDATE 11/19/15: Since last visit, doing worse with HA; could be related to worsening depression and hallucinations and interrupted sleep. Some snoring noted by spouse. Now with almost daily headaches. Now seeing new psychiatrist, and med adjustments are underway. Indomethacin not helping that much, but she uses it occasionally.   UPDATE 08/22/15: Since last visit, having new issues --> since Dec 2016 having intermittent visual hallucinations (spiders, people, animals). Has completed IOP program and now on abilify. Tolerating TPX ER 100mg  daily and gabapentin 300mg  daily. Still with 4-6 days HA per month (missing work).   UPDATE 10/19/14: Since last visit, continues with intermittent HA (2 types as per last note). Tried TPX 50mg  BID without relief, then added gabapentin 300mg  daily (mild relief) and reduce TPX to 75mg  at bedtime. Occ uses rizatriptan, but saves it for severe HA only. Still missing up to 4 days of work per month. Some ipsilateral "heat" sensation with headache, but no lacrimation or nasal congestions. No caffeine or alcohol. She eats clean, few carbs, mainly veggies and protein. Sleeps 8-9 hrs per night. Lives with 1 child at home (who is autistic).  Reports history of venous angioma x 2 in the brain, dx'd many years ago.   PRIOR HPI (09/27/14): 35 year old right-handed female with polycystic ovarian syndrome, migraine, depression, here for evaluation of headaches. Patient referred for consultation by Dr. [name]. Patient  developed migraine headaches in her 22s. She describes occipital, posterior, throbbing headaches with photophobia, phonophobia, nausea. Patient was able to control headaches in the past with over-the-counter medications and more recently with topiramate and Maxalt. Patient also has had nocturnal vertex headaches with severe vomiting, shaking sensation, slurred speech, burning sensation on the top of her head. Headaches improved in her late 35s and early 35s. Over past 1-2 months headaches have become more severe. She has missed 8 days of work recently due to severe headaches. She is again having 2 types of headaches. The more troublesome headaches are the nocturnal burning severe headaches. No specific triggering factors. Headaches seem to be worse in the springtime. She has been on antinausea medications, tramadol, ibuprofen and Tylenol without relief.   REVIEW OF SYSTEMS: Full 14 system review of systems performed and negative except for: depression anxiety hallucinations light sens fatigue.    ALLERGIES: Allergies  Allergen Reactions  . Asa [Aspirin] Other (See Comments)    Patient stated that she can not take any blood thinners    HOME MEDICATIONS: Outpatient Prescriptions Prior to Visit  Medication Sig Dispense Refill  . ARIPiprazole (ABILIFY) 2 MG tablet Take 1 tablet (2 mg total) by mouth daily. 30 tablet 0  . busPIRone (BUSPAR) 15 MG tablet Take 15 mg by mouth daily.    . clonazePAM (KLONOPIN) 0.5 MG tablet Take 0.5 mg by mouth 2 (two) times daily as needed for anxiety.    Marland Kitchen escitalopram (LEXAPRO) 20 MG tablet     . fluticasone (FLONASE) 50 MCG/ACT nasal spray     . indomethacin (INDOCIN) 50 MG capsule Take 1 capsule (50 mg  total) by mouth 2 (two) times daily as needed. 60 capsule 6  . levonorgestrel (MIRENA) 20 MCG/24HR IUD 1 each by Intrauterine route continuous.    . ondansetron (ZOFRAN) 4 MG tablet     . gabapentin (NEURONTIN) 300 MG capsule Take 1 capsule (300 mg total) by mouth  3 (three) times daily. 270 capsule 4  . Topiramate ER 100 MG CP24 Take 100 mg by mouth at bedtime. 90 capsule 4  . amoxicillin (AMOXIL) 500 MG capsule     . ibuprofen (ADVIL,MOTRIN) 100 MG chewable tablet Chew by mouth every 8 (eight) hours as needed.     No facility-administered medications prior to visit.    PAST MEDICAL HISTORY: Past Medical History  Diagnosis Date  . PCOS (polycystic ovarian syndrome)   . Kidney infection   . Headache   . Major depression ([place])   . Anxiety     PAST SURGICAL HISTORY: Past Surgical History  Procedure Laterality Date  . Wisdom tooth extraction  08/2015    FAMILY HISTORY: Family History  Problem Relation Age of Onset  . Heart disease Mother   . Cancer Mother     skin  . Alcohol abuse Mother   . Depression Mother   . Prostate cancer Father   . Throat cancer Father   . Cancer Father     skin  . Alcohol abuse Father   . Drug abuse Father   . Depression Father   . Kidney failure Maternal Grandmother   . Brain cancer Maternal Grandfather   . Lung cancer Maternal Grandfather   . Autism Son   . Bipolar disorder Sister   . Anxiety disorder Brother   . Anxiety disorder Paternal Grandmother     SOCIAL HISTORY:  Social History   Social History  . Marital Status: Married    Spouse Name: N/A  . Number of Children: 1  . Years of Education: N/A   Occupational History  . [employer]    Social History Main Topics  . Smoking status: Current Some Day Smoker -- 0.25 packs/day    Types: Cigarettes  . Smokeless tobacco: Not on file     Comment: 08/22/15 smoke when stressed  . Alcohol Use: No  . Drug Use: No  . Sexual Activity: Yes    Birth Control/ Protection: IUD   Other Topics Concern  . Not on file   Social History Narrative     PHYSICAL EXAM  Filed Vitals:   11/19/15 1448  BP: 102/70  Pulse: 80  Height: 5\' 10"  (1.778 m)  Weight: 211 lb 9.6 oz (95.981 kg)    Body mass index is 30.36 kg/(m^2).  No exam  data present  No flowsheet data found.  GENERAL EXAM: Patient is in no distress; well developed, nourished and groomed; neck is supple  CARDIOVASCULAR: Regular rate and rhythm, no murmurs, no carotid bruits  NEUROLOGIC: MENTAL STATUS: awake, alert, language fluent, comprehension intact, naming intact, fund of knowledge appropriate CRANIAL NERVE: no papilledema on fundoscopic exam, pupils equal and reactive to light, visual fields full to confrontation, extraocular muscles intact, no nystagmus, facial sensation and strength symmetric, hearing intact, palate elevates symmetrically, uvula midline, shoulder shrug symmetric, tongue midline. MOTOR: normal bulk and tone, full strength in the BUE, BLE SENSORY: normal and symmetric to light touch  COORDINATION: finger-nose-finger, fine finger movements normal REFLEXES: deep tendon reflexes present and symmetric GAIT/STATION: narrow based gait; romberg is negative    DIAGNOSTIC DATA (LABS, IMAGING, TESTING) - I reviewed  patient records, labs, notes, testing and imaging myself where available.  Lab Results  Component Value Date   WBC 6.9 05/10/2012   HGB 14.2 05/10/2012   HCT 41.1 05/10/2012   MCV 84.4 05/10/2012   PLT 223 05/10/2012      Component Value Date/Time   NA 135 05/10/2012 0230   K 3.4* 05/10/2012 0230   CL 101 05/10/2012 0230   CO2 24 05/10/2012 0230   GLUCOSE 80 05/10/2012 0230   BUN 13 05/10/2012 0230   CREATININE 0.56 05/10/2012 0230   CALCIUM 9.3 05/10/2012 0230   PROT 7.4 05/10/2012 0230   ALBUMIN 3.9 05/10/2012 0230   AST 22 05/10/2012 0230   ALT 25 05/10/2012 0230   ALKPHOS 68 05/10/2012 0230   BILITOT 0.3 05/10/2012 0230   GFRNONAA >90 05/10/2012 0230   GFRAA >90 05/10/2012 0230   No results found for: CHOL, HDL, LDLCALC, LDLDIRECT, TRIG, CHOLHDL No results found for: HGBA1C No results found for: VITAMINB12 No results found for: TSH  March 2016 MRI brain ([facility]) - normal per referring MD  notes  10/26/15 MRI brain 1. There are no acute findings. 2. A right frontal draining vein is mildly enlarged though there are no significant developmental venous anomalies. this is unlikely to be clinically significant.     ASSESSMENT AND PLAN  35 y.o. year old female here with history of migraine since 2000's, now with increasing headaches. Normal neuro exam and MRI brain. 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   Dx:  Intractable migraine without aura and without status migrainosus  Visual hallucination  Snoring  Excessive daytime sleepiness     PLAN: - continue gabapentin 300mg  three times per day - continue topiramate ER 100mg  at bedtime - continue indomethacin 50mg  as needed for breakthrough headaches - consider sleep study (snoring, interrupted sleep, daytime fatigue)  Meds ordered this encounter  Medications  . Topiramate ER 100 MG CP24    Sig: Take 100 mg by mouth at bedtime.    Dispense:  90 capsule    Refill:  4  . gabapentin (NEURONTIN) 300 MG capsule    Sig: Take 1 capsule (300 mg total) by mouth 3 (three) times daily.    Dispense:  270 capsule    Refill:  4   Return in about 3 months (around 02/19/2016).    Penni Bombard, MD [id number], [id number] PM Certified in Neurology, Neurophysiology and Neuroimaging  [facility] [address] [place], [place] [address] [phone]

## 2015-11-22 ENCOUNTER — Encounter: Payer: Self-pay | Admitting: *Deleted

## 2015-11-23 ENCOUNTER — Telehealth: Payer: Self-pay | Admitting: Diagnostic Neuroimaging

## 2015-11-23 NOTE — Telephone Encounter (Signed)
Heather Gardner/[facility] [place] called to check status of PA that was sent to our office electronically on 11/19/15 for Topiramate ER 100 MG CP24.

## 2015-11-23 NOTE — Telephone Encounter (Signed)
Spoke with Vicente Males, [place] and informed her this RN got BC[employer] approval yesterday through 06/29/2038. She checked status and stated she had approval. She verbalized understanding, appreciation for call.

## 2015-12-11 ENCOUNTER — Encounter: Payer: Self-pay | Admitting: *Deleted

## 2016-01-02 ENCOUNTER — Encounter: Payer: Self-pay | Admitting: Obstetrics and Gynecology

## 2016-01-02 ENCOUNTER — Ambulatory Visit (INDEPENDENT_AMBULATORY_CARE_PROVIDER_SITE_OTHER): Payer: BLUE CROSS/BLUE SHIELD | Admitting: Obstetrics and Gynecology

## 2016-01-02 VITALS — BP 106/74 | HR 76 | Resp 18 | Ht 71.0 in | Wt 223.0 lb

## 2016-01-02 DIAGNOSIS — N83202 Unspecified ovarian cyst, left side: Secondary | ICD-10-CM | POA: Diagnosis not present

## 2016-01-02 DIAGNOSIS — R300 Dysuria: Secondary | ICD-10-CM

## 2016-01-02 DIAGNOSIS — R109 Unspecified abdominal pain: Secondary | ICD-10-CM | POA: Diagnosis not present

## 2016-01-02 LAB — POCT URINALYSIS DIPSTICK
BILIRUBIN UA: NEGATIVE
Glucose, UA: NEGATIVE
KETONES UA: NEGATIVE
Nitrite, UA: NEGATIVE
PH UA: 6.5
SPEC GRAV UA: 1.015
Urobilinogen, UA: 0.2

## 2016-01-02 LAB — POCT URINE PREGNANCY: Preg Test, Ur: NEGATIVE

## 2016-01-02 MED ORDER — SULFAMETHOXAZOLE-TRIMETHOPRIM 800-160 MG PO TABS: 1.0000 | ORAL_TABLET | Freq: Two times a day (BID) | ORAL | Status: DC

## 2016-01-02 NOTE — Progress Notes (Signed)
Obstetrics and Gynecology Visit Problem Visit Evaluation  Appointment Date: 01/02/2016  Primary Care Provider: [facility]  Referring Provider: Cari Caraway, MD  Chief Complaint: Left sided abdominal pain History of Present Illness: Heather Gardner is a 35 y.o. Caucasian G1P1001 (No LMP recorded. Patient is not currently having periods (Reason: IUD).), seen for the above chief complaint. Her past medical history is significant for anxiety/depression, Mirena in place, PCOS, migraines.  Patient states that s/s started a few days ago and have been persisting. It's left sided, feels like ?cyst pain in the past, with some nausea w/o vomiting, fevers. No dysuria, hematuria, blood in BMs, malodorous urine. ?vaginal discharge w/o itching  Review of Systems: as per HPI  Past Medical History:  Past Medical History  Diagnosis Date  . PCOS (polycystic ovarian syndrome)   . Kidney infection   . Headache   . Major depression ([place])   . Anxiety     Past Surgical History:  Past Surgical History  Procedure Laterality Date  . Wisdom tooth extraction  08/2015    Past Obstetrical History:  OB History    Gravida Para Term Preterm AB TAB SAB Ectopic Multiple Living   1 1 1       1       Past Gynecological History: As per HPI. Last pap negative 2017  Social History:  Social History   Social History  . Marital Status: Married    Spouse Name: N/A  . Number of Children: 1  . Years of Education: N/A   Occupational History  . [place] History Main Topics  . Smoking status: Current Some Day Smoker -- 0.25 packs/day    Types: Cigarettes  . Smokeless tobacco: Not on file     Comment: 08/22/15 smoke when stressed  . Alcohol Use: No  . Drug Use: No  . Sexual Activity: Yes    Birth Control/ Protection: IUD   Other Topics Concern  . Not on file   Social History Narrative    Family History:  Family History  Problem Relation Age of Onset  . Heart disease  Mother   . Cancer Mother     skin  . Alcohol abuse Mother   . Depression Mother   . Prostate cancer Father   . Throat cancer Father   . Cancer Father     skin  . Alcohol abuse Father   . Drug abuse Father   . Depression Father   . Kidney failure Maternal Grandmother   . Brain cancer Maternal Grandfather   . Lung cancer Maternal Grandfather   . Autism Son   . Bipolar disorder Sister   . Anxiety disorder Brother   . Anxiety disorder Paternal Grandmother     Medications Ms. Tugwell had no medications administered during this visit. Current Outpatient Prescriptions  Medication Sig Dispense Refill  . busPIRone (BUSPAR) 15 MG tablet Take 10 mg by mouth daily.     . clonazePAM (KLONOPIN) 0.5 MG tablet Take 0.5 mg by mouth 2 (two) times daily as needed for anxiety.    . fluticasone (FLONASE) 50 MCG/ACT nasal spray     . gabapentin (NEURONTIN) 300 MG capsule Take 1 capsule (300 mg total) by mouth 3 (three) times daily. 270 capsule 4  . lamoTRIgine (LAMICTAL) 25 MG tablet Take 20 mg by mouth daily.    Marland Kitchen levonorgestrel (MIRENA) 20 MCG/24HR IUD 1 each by Intrauterine route continuous.    . ondansetron (ZOFRAN) 4 MG tablet     .  Topiramate ER 100 MG CP24 Take 100 mg by mouth at bedtime. 90 capsule 4   No current facility-administered medications for this visit.    Allergies Asa   Physical Exam:  BP 106/74 mmHg  Pulse 76  Resp 18  Ht 5\' 11"  (1.803 m)  Wt 223 lb (101.152 kg)  BMI 31.12 kg/m2 Body mass index is 31.12 kg/(m^2). General appearance: Well nourished, well developed female in no acute distress.  Neck:  Supple, normal appearance, and no thyromegaly  Cardiovascular: normal s1 and s2.  No murmurs, rubs or gallops. Respiratory:  Clear to auscultation bilateral. Normal respiratory effort Abdomen: positive bowel sounds and no masses, hernias; diffusely non tender to palpation, non distended Back: no CVAT Neuro/Psych:  Normal mood and affect.  Skin:  Warm and dry.   Lymphatic:  No inguinal lymphadenopathy.   Pelvic exam: is not limited by body habitus EGBUS: within normal limits Vagina: within normal limits and with no blood in the vault, Cervix:  no lesions or cervical motion tenderness. +strings seen Uterus:  nonenlarged Adnexa:  normal adnexa and no mass, fullness, tenderness Rectovaginal: deferred  Laboratory: u/a with moderate leuks, UPT negative  Radiology: bedside TVUS see note but with 3cm simple appearing LO cyst with +AV flow  Assessment: simple left ovarian cyst (likely CL cyst) with pain  Plan:   No e/o torsion and pt doesn't have any s/s of menses ever, but I told her that she still will ovulate and have cysts as normal with a Mirena and that her s/s should get better with time. No e/o torsion or rupture of a cyst but torsion precautions given and pt told that if s/s worsen to come to ER for evaluation. Since it's small and simple appearing and given her age, I told her that if her s/s go away that she doesn't need follow up of it.  UCx sent and will start on bactrim given urine results  RTC PRN  Durene Romans MD Attending Center for [facility])

## 2016-01-02 NOTE — Patient Instructions (Signed)
Ovarian Cyst An ovarian cyst is a fluid-filled sac that forms on an ovary. The ovaries are small organs that produce eggs in women. Various types of cysts can form on the ovaries. Most are not cancerous. Many do not cause problems, and they often go away on their own. Some may cause symptoms and require treatment. Common types of ovarian cysts include:  Functional cysts--These cysts may occur every month during the menstrual cycle. This is normal. The cysts usually go away with the next menstrual cycle if the woman does not get pregnant. Usually, there are no symptoms with a functional cyst.  Endometrioma cysts--These cysts form from the tissue that lines the uterus. They are also called "chocolate cysts" because they become filled with blood that turns brown. This type of cyst can cause pain in the lower abdomen during intercourse and with your menstrual period.  Cystadenoma cysts--This type develops from the cells on the outside of the ovary. These cysts can get very big and cause lower abdomen pain and pain with intercourse. This type of cyst can twist on itself, cut off its blood supply, and cause severe pain. It can also easily rupture and cause a lot of pain.  Dermoid cysts--This type of cyst is sometimes found in both ovaries. These cysts may contain different kinds of body tissue, such as skin, teeth, hair, or cartilage. They usually do not cause symptoms unless they get very big.  Theca lutein cysts--These cysts occur when too much of a certain hormone (human chorionic gonadotropin) is produced and overstimulates the ovaries to produce an egg. This is most common after procedures used to assist with the conception of a baby (in vitro fertilization). CAUSES   Fertility drugs can cause a condition in which multiple large cysts are formed on the ovaries. This is called ovarian hyperstimulation syndrome.  A condition called polycystic ovary syndrome can cause hormonal imbalances that can lead to  nonfunctional ovarian cysts. SIGNS AND SYMPTOMS  Many ovarian cysts do not cause symptoms. If symptoms are present, they may include:  Pelvic pain or pressure.  Pain in the lower abdomen.  Pain during sexual intercourse.  Increasing girth (swelling) of the abdomen.  Abnormal menstrual periods.  Increasing pain with menstrual periods.  Stopping having menstrual periods without being pregnant. DIAGNOSIS  These cysts are commonly found during a routine or annual pelvic exam. Tests may be ordered to find out more about the cyst. These tests may include:  Ultrasound.  X-ray of the pelvis.  CT scan.  MRI.  Blood tests. TREATMENT  Many ovarian cysts go away on their own without treatment. Your health care provider may want to check your cyst regularly for 2-3 months to see if it changes. For women in menopause, it is particularly important to monitor a cyst closely because of the higher rate of ovarian cancer in menopausal women. When treatment is needed, it may include any of the following:  A procedure to drain the cyst (aspiration). This may be done using a long needle and ultrasound. It can also be done through a laparoscopic procedure. This involves using a thin, lighted tube with a tiny camera on the end (laparoscope) inserted through a small incision.  Surgery to remove the whole cyst. This may be done using laparoscopic surgery or an open surgery involving a larger incision in the lower abdomen.  Hormone treatment or birth control pills. These methods are sometimes used to help dissolve a cyst. HOME CARE INSTRUCTIONS   Only take over-the-counter   or prescription medicines as directed by your health care provider.  Follow up with your health care provider as directed.  Get regular pelvic exams and Pap tests. SEEK MEDICAL CARE IF:   Your periods are late, irregular, or painful, or they stop.  Your pelvic pain or abdominal pain does not go away.  Your abdomen becomes  larger or swollen.  You have pressure on your bladder or trouble emptying your bladder completely.  You have pain during sexual intercourse.  You have feelings of fullness, pressure, or discomfort in your stomach.  You lose weight for no apparent reason.  You feel generally ill.  You become constipated.  You lose your appetite.  You develop acne.  You have an increase in body and facial hair.  You are gaining weight, without changing your exercise and eating habits.  You think you are pregnant. SEEK IMMEDIATE MEDICAL CARE IF:   You have increasing abdominal pain.  You feel sick to your stomach (nauseous), and you throw up (vomit).  You develop a fever that comes on suddenly.  You have abdominal pain during a bowel movement.  Your menstrual periods become heavier than usual. MAKE SURE YOU:  Understand these instructions.  Will watch your condition.  Will get help right away if you are not doing well or get worse.   This information is not intended to replace advice given to you by your health care provider. Make sure you discuss any questions you have with your health care provider.   Document Released: 06/16/2005 Document Revised: 06/21/2013 Document Reviewed: 02/21/2013 Elsevier Interactive Patient Education 2016 Elsevier Inc.  

## 2016-01-02 NOTE — Addendum Note (Signed)
Addended by: Ricka Burdock on: 01/02/2016 01:44 PM   Modules accepted: Orders

## 2016-01-02 NOTE — Progress Notes (Signed)
Pt here today c/o left lower pelvic pain since Saturday on and off that is progressively getting worse.  Pt also reports running a fever and occasional nausea.

## 2016-01-02 NOTE — Procedures (Signed)
Transvaginal Ultrasound Note  Pre-operative Diagnosis: LLQ pain  Post-operative Diagnosis: same. Simple left ovarian cyst  Procedure Details  The patient was placed in the dorsal lithotomy position.  A Graves' speculum inserted in the vagina, and normal cervix and vaginal vault with IUD strings seen coming from the cervix. Speculum removed and transvaginal probe inserted  Uterus slightly RV to mid plane, 6.5 x 5.4 x 3.8cm, ES 62mm with IUD seen in the proper, fundal location. RO: 2.4 x 1.7 x 1.5cm, LO: 4.1 x 4.38 x 4.1cm and 2.8 x 2.7 x 3.3cm cyst seen (homogenous, non echogenic fluid, or increased vascularity) with + arterial and venous flow seen. No free fluid in the pelvis  Complications: None  Plan: See progress note.   Durene Romans MD Attending Center for [employer] [facility])

## 2016-01-04 LAB — URINE CULTURE: Colony Count: >=100000

## 2016-01-16 ENCOUNTER — Telehealth: Payer: Self-pay | Admitting: *Deleted

## 2016-01-16 NOTE — Telephone Encounter (Signed)
Received papers back from patient re: "medical report for disability eligibility review". Patient requesting another diagnosis be added. Spoke with patient and informed her that the diagnosis she is requesting is not in Epic MR nor Dr [employer] office note. Therefore this RN cannot add it to her paperwork. She stated she is seeing provider on Friday that treats her for said diagnosis, and she will find out how to get that to her record in this office.  She is aware Dr Leta Baptist is out of office until next Thurs,and this RN is off this Friday. She stated she will send my chart message with new information. She verbalized appreciation.

## 2016-01-22 ENCOUNTER — Encounter: Payer: Self-pay | Admitting: Diagnostic Neuroimaging

## 2016-01-22 NOTE — Telephone Encounter (Signed)
Received my chart message from patient today stating another physician is assisting her with paper work. She requested it not be completed, and she will pick up at front desk. Papers taken to front desk; Hinton Dyer notified patient will pick up.

## 2016-01-31 ENCOUNTER — Encounter: Payer: Self-pay | Admitting: Diagnostic Neuroimaging

## 2016-02-01 ENCOUNTER — Encounter: Payer: Self-pay | Admitting: *Deleted

## 2016-02-01 ENCOUNTER — Telehealth: Payer: Self-pay | Admitting: *Deleted

## 2016-02-01 NOTE — Telephone Encounter (Signed)
Spoke with patient re: my chart request for letter for her HR. She reported she is currently have 15- 20 headaches, migraines a month. She is taking Idomethicin fairly often for headaches, taking Ibuprofen if the headaches are mild. Advised her the letter will not be ready until next Monday at earliest. She stated HR doesn't need letter until 02/12/16, and she will pick up later next week. She verbalized understanding, appreciation for assistance in this matter.

## 2016-02-03 ENCOUNTER — Encounter: Payer: Self-pay | Admitting: Diagnostic Neuroimaging

## 2016-02-04 ENCOUNTER — Telehealth: Payer: Self-pay | Admitting: *Deleted

## 2016-02-04 NOTE — Telephone Encounter (Signed)
Per Otila Kluver, infusion RN, patient may receive infusion at 2 pm today; left VM with information and requested call back asap. Advised if she needs medication for nausea she must have a driver.  Patient called back within a few minutes of VM being left, and she stated she does need medication for nausea. She confirmed she will come in at 2 pm and have a driver.  Informed Otila Kluver, RN.

## 2016-02-18 ENCOUNTER — Ambulatory Visit (INDEPENDENT_AMBULATORY_CARE_PROVIDER_SITE_OTHER): Payer: BLUE CROSS/BLUE SHIELD | Admitting: Diagnostic Neuroimaging

## 2016-02-18 ENCOUNTER — Telehealth: Payer: Self-pay | Admitting: *Deleted

## 2016-02-18 ENCOUNTER — Encounter: Payer: Self-pay | Admitting: Diagnostic Neuroimaging

## 2016-02-18 VITALS — BP 111/70 | HR 77 | Wt 227.6 lb

## 2016-02-18 DIAGNOSIS — G4719 Other hypersomnia: Secondary | ICD-10-CM | POA: Diagnosis not present

## 2016-02-18 DIAGNOSIS — R0683 Snoring: Secondary | ICD-10-CM | POA: Diagnosis not present

## 2016-02-18 DIAGNOSIS — D1802 Hemangioma of intracranial structures: Secondary | ICD-10-CM

## 2016-02-18 DIAGNOSIS — G43019 Migraine without aura, intractable, without status migrainosus: Secondary | ICD-10-CM

## 2016-02-18 MED ORDER — TOPIRAMATE ER 100 MG PO CAP24: 200.0000 mg | ORAL_CAPSULE | Freq: Every day | ORAL | 4 refills | Status: DC

## 2016-02-18 MED ORDER — ONDANSETRON HCL 4 MG PO TABS: 4.0000 mg | ORAL_TABLET | Freq: Three times a day (TID) | ORAL | 6 refills | Status: DC | PRN

## 2016-02-18 MED ORDER — GABAPENTIN 300 MG PO CAPS: 300.0000 mg | ORAL_CAPSULE | Freq: Three times a day (TID) | ORAL | 4 refills | Status: DC

## 2016-02-18 MED ORDER — INDOMETHACIN 50 MG PO CAPS: 50.0000 mg | ORAL_CAPSULE | Freq: Two times a day (BID) | ORAL | 6 refills | Status: DC | PRN

## 2016-02-18 MED ORDER — ZOLMITRIPTAN 5 MG NA SOLN: 1.0000 | NASAL | 6 refills | Status: DC | PRN

## 2016-02-18 NOTE — Telephone Encounter (Signed)
PA for Zomig started on cover [facility].

## 2016-02-18 NOTE — Progress Notes (Signed)
[facility]  PATIENT: Heather Gardner DOB: 1980-11-25  REFERRING CLINICIAN: [name], MD HISTORY FROM: patient  REASON FOR VISIT: follow up   HISTORICAL  CHIEF COMPLAINT:  Chief Complaint  Patient presents with  . Migraine    rm 7, "Ha, migraine more days than not; wake up with them; throbbing and lots of nausea; I take Indomethicin up to 2 tabs, or Ibuprofen 400 mg up to twice a day"  . Follow-up    3 month    HISTORY OF PRESENT ILLNESS:   UPDATE 02/18/16: Since last visit, continues with headaches 16-20 days per month. (8-10 per month of frontal aching headache; 8-10 days of migraine with neck pain, throbbing, nausea and photophobia). Treating 10-15 days of headache per month. Using indomethacin 2x per day (10-15 days). Also on gabapentin 300mg  TID and Topiramate 100mg  qhs for migraine prevention.   UPDATE 11/19/15: Since last visit, doing worse with HA; could be related to worsening depression and hallucinations and interrupted sleep. Some snoring noted by spouse. Now with almost daily headaches. Now seeing new psychiatrist, and med adjustments are underway. Indomethacin not helping that much, but she uses it occasionally.   UPDATE 08/22/15: Since last visit, having new issues --> since Dec 2016 having intermittent visual hallucinations (spiders, people, animals). Has completed IOP program and now on abilify. Tolerating TPX ER 100mg  daily and gabapentin 300mg  daily. Still with 4-6 days HA per month (missing work).   UPDATE 10/19/14: Since last visit, continues with intermittent HA (2 types as per last note). Tried TPX 50mg  BID without relief, then added gabapentin 300mg  daily (mild relief) and reduce TPX to 75mg  at bedtime. Occ uses rizatriptan, but saves it for severe HA only. Still missing up to 4 days of work per month. Some ipsilateral "heat" sensation with headache, but no lacrimation or nasal congestions. No caffeine or alcohol. She eats clean, few carbs,  mainly veggies and protein. Sleeps 8-9 hrs per night. Lives with 1 child at home (who is autistic).  Reports history of venous angioma x 2 in the brain, dx'd many years ago.   PRIOR HPI (09/27/14): 35 year old right-handed female with polycystic ovarian syndrome, migraine, depression, here for evaluation of headaches. Patient referred for consultation by Dr. [name]. Patient developed migraine headaches in her 83s. She describes occipital, posterior, throbbing headaches with photophobia, phonophobia, nausea. Patient was able to control headaches in the past with over-the-counter medications and more recently with topiramate and Maxalt. Patient also has had nocturnal vertex headaches with severe vomiting, shaking sensation, slurred speech, burning sensation on the top of her head. Headaches improved in her late 62s and early 62s. Over past 1-2 months headaches have become more severe. She has missed 8 days of work recently due to severe headaches. She is again having 2 types of headaches. The more troublesome headaches are the nocturnal burning severe headaches. No specific triggering factors. Headaches seem to be worse in the springtime. She has been on antinausea medications, tramadol, ibuprofen and Tylenol without relief.   REVIEW OF SYSTEMS: Full 14 system review of systems performed and negative except for: depression anxiety hallucinations light sens fatigue dizziness suicidal thoughts freq waking snoring insomnia nausea.    ALLERGIES: Allergies  Allergen Reactions  . Asa [Aspirin] Other (See Comments)    Patient stated that she can not take any blood thinners    HOME MEDICATIONS: Outpatient Medications Prior to Visit  Medication Sig Dispense Refill  . busPIRone (BUSPAR) 15 MG tablet Take 10 mg by  mouth daily.     . clonazePAM (KLONOPIN) 0.5 MG tablet Take 0.5 mg by mouth 2 (two) times daily as needed for anxiety.    . fluticasone (FLONASE) 50 MCG/ACT nasal spray     . gabapentin (NEURONTIN)  300 MG capsule Take 1 capsule (300 mg total) by mouth 3 (three) times daily. 270 capsule 4  . indomethacin (INDOCIN) 50 MG capsule Take 1 capsule (50 mg total) by mouth 2 (two) times daily as needed. 60 capsule 6  . lamoTRIgine (LAMICTAL) 25 MG tablet Take 20 mg by mouth daily.    Marland Kitchen levonorgestrel (MIRENA) 20 MCG/24HR IUD 1 each by Intrauterine route continuous.    . ondansetron (ZOFRAN) 4 MG tablet     . Topiramate ER 100 MG CP24 Take 100 mg by mouth at bedtime. 90 capsule 4  . sulfamethoxazole-trimethoprim (BACTRIM DS,SEPTRA DS) 800-160 MG tablet Take 1 tablet by mouth 2 (two) times daily. 6 tablet 0   No facility-administered medications prior to visit.     PAST MEDICAL HISTORY: Past Medical History:  Diagnosis Date  . Anxiety   . Headache   . Kidney infection   . Major depression ([place])   . PCOS (polycystic ovarian syndrome)     PAST SURGICAL HISTORY: Past Surgical History:  Procedure Laterality Date  . WISDOM TOOTH EXTRACTION  08/2015    FAMILY HISTORY: Family History  Problem Relation Age of Onset  . Heart disease Mother   . Cancer Mother     skin  . Alcohol abuse Mother   . Depression Mother   . Prostate cancer Father   . Throat cancer Father   . Cancer Father     skin  . Alcohol abuse Father   . Drug abuse Father   . Depression Father   . Bipolar disorder Sister   . Anxiety disorder Brother   . Kidney failure Maternal Grandmother   . Brain cancer Maternal Grandfather   . Lung cancer Maternal Grandfather   . Autism Son   . Anxiety disorder Paternal Grandmother     SOCIAL HISTORY:  Social History   Social History  . Marital status: Married    Spouse name: N/A  . Number of children: 1  . Years of education: N/A   Occupational History  . [place] History Main Topics  . Smoking status: Current Some Day Smoker    Packs/day: 0.25    Types: Cigarettes  . Smokeless tobacco: Not on file     Comment: 08/22/15 smoke when stressed    . Alcohol use No  . Drug use: No  . Sexual activity: Yes    Birth control/ protection: IUD   Other Topics Concern  . Not on file   Social History Narrative  . No narrative on file     PHYSICAL EXAM  Vitals:   02/18/16 0815  BP: 111/70  Pulse: 77  Weight: 227 lb 9.6 oz (103.2 kg)   Wt Readings from Last 3 Encounters:  02/18/16 227 lb 9.6 oz (103.2 kg)  01/02/16 223 lb (101.2 kg)  11/19/15 211 lb 9.6 oz (96 kg)     Body mass index is 31.74 kg/m.  No exam data present  No flowsheet data found.  GENERAL EXAM: Patient is in no distress; well developed, nourished and groomed; neck is supple  CARDIOVASCULAR: Regular rate and rhythm, no murmurs, no carotid bruits  NEUROLOGIC: MENTAL STATUS: awake, alert, language fluent, comprehension intact, naming intact, fund of knowledge  appropriate CRANIAL NERVE: no papilledema on fundoscopic exam, pupils equal and reactive to light, visual fields full to confrontation, extraocular muscles intact, no nystagmus, facial sensation and strength symmetric, hearing intact, palate elevates symmetrically, uvula midline, shoulder shrug symmetric, tongue midline. MOTOR: normal bulk and tone, full strength in the BUE, BLE SENSORY: normal and symmetric to light touch  COORDINATION: finger-nose-finger, fine finger movements normal REFLEXES: deep tendon reflexes present and symmetric GAIT/STATION: narrow based gait; romberg is negative    DIAGNOSTIC DATA (LABS, IMAGING, TESTING) - I reviewed patient records, labs, notes, testing and imaging myself where available.  Lab Results  Component Value Date   WBC 6.9 05/10/2012   HGB 14.2 05/10/2012   HCT 41.1 05/10/2012   MCV 84.4 05/10/2012   PLT 223 05/10/2012      Component Value Date/Time   NA 135 05/10/2012 0230   K 3.4 (L) 05/10/2012 0230   CL 101 05/10/2012 0230   CO2 24 05/10/2012 0230   GLUCOSE 80 05/10/2012 0230   BUN 13 05/10/2012 0230   CREATININE 0.56 05/10/2012 0230    CALCIUM 9.3 05/10/2012 0230   PROT 7.4 05/10/2012 0230   ALBUMIN 3.9 05/10/2012 0230   AST 22 05/10/2012 0230   ALT 25 05/10/2012 0230   ALKPHOS 68 05/10/2012 0230   BILITOT 0.3 05/10/2012 0230   GFRNONAA >90 05/10/2012 0230   GFRAA >90 05/10/2012 0230   No results found for: CHOL, HDL, LDLCALC, LDLDIRECT, TRIG, CHOLHDL No results found for: HGBA1C No results found for: VITAMINB12 No results found for: TSH  March 2016 MRI brain ([facility]) - normal per referring MD notes  10/26/15 MRI brain 1. There are no acute findings. 2. A right frontal draining vein is mildly enlarged though there are no significant developmental venous anomalies. this is unlikely to be clinically significant.     ASSESSMENT AND PLAN  35 y.o. year old female here with history of migraine since 2000's, now with increasing headaches. Normal neuro exam and MRI brain. 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   Tried and failed: topiramate, gabapentin, rizatriptan, indomethacin   Dx:  Intractable migraine without aura and without status migrainosus  Snoring  Excessive daytime sleepiness  Venous angioma of brain (HCC)     PLAN: - continue gabapentin 300mg  three times per day - increase topiramate ER to 100mg  twice a day - continue indomethacin 50mg  as needed for breakthrough headaches - trial of zomig nasal spray - check sleep study (snoring, interrupted sleep, daytime fatigue)  Meds ordered this encounter  Medications  . gabapentin (NEURONTIN) 300 MG capsule    Sig: Take 1 capsule (300 mg total) by mouth 3 (three) times daily.    Dispense:  270 capsule    Refill:  4  . indomethacin (INDOCIN) 50 MG capsule    Sig: Take 1 capsule (50 mg total) by mouth 2 (two) times daily as needed.    Dispense:  60 capsule    Refill:  6  . ondansetron (ZOFRAN) 4 MG tablet    Sig: Take 1 tablet (4 mg total) by mouth  every 8 (eight) hours as needed for nausea or vomiting.    Dispense:  30 tablet    Refill:  6  . Topiramate ER 100 MG CP24    Sig: Take 200 mg by mouth at bedtime.    Dispense:  180 capsule    Refill:  4  . zolmitriptan (ZOMIG) 5 MG nasal solution  Sig: Place 1 spray into the nose as needed for migraine (maximum 1 spray per day).    Dispense:  6 Units    Refill:  6   Orders Placed This Encounter  Procedures  . Ambulatory referral to Sleep Studies   Return in about 3 months (around 05/20/2016).    Penni Bombard, MD [id number], [id number] AM Certified in Neurology, Neurophysiology and Neuroimaging  [facility] [address], [place], [place] [address] [phone]

## 2016-02-18 NOTE — Patient Instructions (Signed)
-   continue gabapentin 300mg  three times per day - increase topiramate ER to 200mg  at bedtime (or 100mg  twice a day) - continue indomethacin 50mg  as needed for breakthrough headaches - trial of zomig nasal spray as needed - continue zofran as needed for nausea - check sleep study (snoring, interrupted sleep, daytime fatigue)

## 2016-02-20 ENCOUNTER — Telehealth: Payer: Self-pay | Admitting: *Deleted

## 2016-02-20 MED ORDER — SUMATRIPTAN 20 MG/ACT NA SOLN: 20.0000 mg | NASAL | 6 refills | Status: DC | PRN

## 2016-02-20 NOTE — Telephone Encounter (Signed)
Spoke with Lonn Georgia , [facility] and informed her that patient may try generic zolmitriptan due to insurance denying Zomig spray. She stated the generic zomig is only in pill form, not spray. She stated that generic Sumatriptan comes in a spray form. She stated that if the provider wants to prescribe generic sumatriptan spray, a new prescription will need to be sent.  Will route to Dr Leta Baptist.

## 2016-02-20 NOTE — Telephone Encounter (Signed)
Changed to sumatriptan nasal spray. -VRP

## 2016-02-21 NOTE — Telephone Encounter (Signed)
Medication changed to sumatriptan spray, covered by her insurance.

## 2016-02-21 NOTE — Telephone Encounter (Signed)
LVM informing patient that her insurance did not cover Zomig nasal , spo Dr Leta Baptist sent new prescription for sumatriptan nasal spray to [facility]. Left name, number for questions.

## 2016-03-12 ENCOUNTER — Institutional Professional Consult (permissible substitution): Payer: BLUE CROSS/BLUE SHIELD | Admitting: Neurology

## 2016-03-13 ENCOUNTER — Ambulatory Visit (INDEPENDENT_AMBULATORY_CARE_PROVIDER_SITE_OTHER): Payer: BLUE CROSS/BLUE SHIELD | Admitting: Neurology

## 2016-03-13 ENCOUNTER — Encounter: Payer: Self-pay | Admitting: Neurology

## 2016-03-13 VITALS — BP 102/64 | HR 78 | Resp 16 | Ht 71.0 in | Wt 217.0 lb

## 2016-03-13 DIAGNOSIS — R51 Headache: Secondary | ICD-10-CM

## 2016-03-13 DIAGNOSIS — G4710 Hypersomnia, unspecified: Secondary | ICD-10-CM

## 2016-03-13 DIAGNOSIS — R0683 Snoring: Secondary | ICD-10-CM

## 2016-03-13 DIAGNOSIS — E669 Obesity, unspecified: Secondary | ICD-10-CM | POA: Diagnosis not present

## 2016-03-13 DIAGNOSIS — F39 Unspecified mood [affective] disorder: Secondary | ICD-10-CM | POA: Diagnosis not present

## 2016-03-13 DIAGNOSIS — R519 Headache, unspecified: Secondary | ICD-10-CM

## 2016-03-13 NOTE — Progress Notes (Signed)
Subjective:    Patient ID: Decker Miggins is a 35 y.o. female.  HPI     Star Age, MD, PhD [facility] [address], [address].O. [place], [place] [address]  Dear Bonnita Levan,   I saw your patient, Parish Durie, upon your kind request in my clinic today for initial consultation of her sleep disorder, in particular, concern for underlying obstructive sleep apnea. The patient is unaccompanied today. As you know, Ms. Deprimo is a 35 year old right-handed woman with an underlying medical history of migraine headaches, anxiety, depression, PCOS, and obesity, who reports snoring and excessive daytime somnolence.  Her Epworth sleepiness score is 11 out of 24 today, her fatigue score is 61 out of 63. I reviewed your office note from 02/18/2016. She has gained weight in the realm of 60 lb in the last 1 1/2 years.  Bedtime is around 9 AM, and wake up time is 5:30, she used to teach middle school. She had some post nasal drip. She has some RLS symptoms at times, not sure if she kicks in her sleep. She has occasional AM HAs.  She has a 35 yo son, small dog in her bed and lives with her BF and her son.  She smokes less than 1/2 ppd and wants to quit.  She does not drink alcohol, does not use illicit drugs, no caffeine.  No FHx of OSA. She takes clonazepam at night, 1-2 mg qHS. She feels that her mood is not fully or optimally controlled at this time. She sees Dr. Toy Care fairly frequently and also sees a counselor twice a week.  Her Past Medical History Is Significant For: Past Medical History:  Diagnosis Date  . Anxiety   . Headache   . Kidney infection   . Major depression ([place])   . PCOS (polycystic ovarian syndrome)     Her Past Surgical History Is Significant For: Past Surgical History:  Procedure Laterality Date  . WISDOM TOOTH EXTRACTION  08/2015    Her Family History Is Significant For: Family History  Problem Relation Age of Onset  . Heart disease  Mother   . Cancer Mother     skin  . Alcohol abuse Mother   . Depression Mother   . Prostate cancer Father   . Throat cancer Father   . Cancer Father     skin  . Alcohol abuse Father   . Drug abuse Father   . Depression Father   . Bipolar disorder Sister   . Anxiety disorder Brother   . Kidney failure Maternal Grandmother   . Brain cancer Maternal Grandfather   . Lung cancer Maternal Grandfather   . Autism Son   . Anxiety disorder Paternal Grandmother     Her Social History Is Significant For: Social History   Social History  . Marital status: Married    Spouse name: N/A  . Number of children: 1  . Years of education: N/A   Occupational History  . [place] History Main Topics  . Smoking status: Current Some Day Smoker    Packs/day: 0.25    Types: Cigarettes  . Smokeless tobacco: None     Comment: 08/22/15 smoke when stressed  . Alcohol use No  . Drug use: No  . Sexual activity: Yes    Birth control/ protection: IUD   Other Topics Concern  . None   Social History Narrative  . None    Her Allergies Are:  Allergies  Allergen Reactions  . Asa [Aspirin] Other (See Comments)    Patient stated that she can not take any blood thinners  :   Her Current Medications Are:  Outpatient Encounter Prescriptions as of 03/13/2016  Medication Sig  . busPIRone (BUSPAR) 15 MG tablet Take 10 mg by mouth daily.   . clonazePAM (KLONOPIN) 1 MG tablet Take 1 mg by mouth 3 (three) times daily.  . fluticasone (FLONASE) 50 MCG/ACT nasal spray   . gabapentin (NEURONTIN) 300 MG capsule Take 1 capsule (300 mg total) by mouth 3 (three) times daily.  . Iloperidone 8 MG TABS Take 1 tablet by mouth daily.  . indomethacin (INDOCIN) 50 MG capsule Take 1 capsule (50 mg total) by mouth 2 (two) times daily as needed.  . lamoTRIgine (LAMICTAL) 200 MG tablet Take 200 mg by mouth daily.  Marland Kitchen lamoTRIgine (LAMICTAL) 25 MG tablet Take 20 mg by mouth daily.  Marland Kitchen levonorgestrel  (MIRENA) 20 MCG/24HR IUD 1 each by Intrauterine route continuous.  . metoprolol (TOPROL-XL) 200 MG 24 hr tablet Take 200 mg by mouth daily.  . ondansetron (ZOFRAN) 4 MG tablet Take 1 tablet (4 mg total) by mouth every 8 (eight) hours as needed for nausea or vomiting.  . SUMAtriptan (IMITREX) 20 MG/ACT nasal spray Place 1 spray (20 mg total) into the nose as needed for migraine or headache. May repeat in 2 hours; max 2 doses per day or 8 per month.  . zolmitriptan (ZOMIG) 5 MG nasal solution Place 1 spray into the nose as needed for migraine (maximum 1 spray per day).  . [DISCONTINUED] iloperidone (FANAPT) 4 MG TABS tablet Take 4 mg by mouth daily.  . [DISCONTINUED] clonazePAM (KLONOPIN) 0.5 MG tablet Take 0.5 mg by mouth 2 (two) times daily as needed for anxiety.  . [DISCONTINUED] Topiramate ER 100 MG CP24 Take 200 mg by mouth at bedtime.   No facility-administered encounter medications on file as of 03/13/2016.   :  Review of Systems:  Out of a complete 14 point review of systems, all are reviewed and negative with the exception of these symptoms as listed below:  Review of Systems  Neurological:       Patient has trouble falling and staying asleep, snoring, wakes up feeling tired, headaches, daytime tiredness, takes naps during day.   Epworth Sleepiness Scale 0= would never doze 1= slight chance of dozing 2= moderate chance of dozing 3= high chance of dozing  Sitting and reading: 1 Watching TV:2 Sitting inactive in a public place (ex. Theater or meeting):0 As a passenger in a car for an hour without a break:2 Lying down to rest in the afternoon:3 Sitting and talking to someone:0 Sitting quietly after lunch (no alcohol):3 In a car, while stopped in traffic:0 Total:11   Objective:  Neurologic Exam  Physical Exam Physical Examination:   Vitals:   03/13/16 1113  BP: 102/64  Pulse: 78  Resp: 16    General Examination: The patient is a very pleasant 35 y.o. female in no  acute distress. She appears well-developed and well-nourished and well groomed.   HEENT: Normocephalic, atraumatic, pupils are equal, round and reactive to light and accommodation. Funduscopic exam is normal with sharp disc margins noted. Extraocular tracking is good without limitation to gaze excursion or nystagmus noted. Normal smooth pursuit is noted. Hearing is grossly intact. Face is symmetric with normal facial animation and normal facial sensation. Speech is clear with no dysarthria noted. There is no hypophonia. There is no lip, neck/head,  jaw or voice tremor. Neck is supple with full range of passive and active motion. There are no carotid bruits on auscultation. Oropharynx exam reveals: mild mouth dryness, good dental hygiene and moderate airway crowding, due to smaller airway entry, larger uvula, tonsils in place. Mallampati is class II. Tongue protrudes centrally and palate elevates symmetrically. Tonsils are 1+ in size. Neck size is 14 5/8 inches. She has a very mild overbite. Nasal inspection reveals no significant nasal mucosal bogginess or redness and no septal deviation.   Chest: Clear to auscultation without wheezing, rhonchi or crackles noted.  Heart: S1+S2+0, regular and normal without murmurs, rubs or gallops noted.   Abdomen: Soft, non-tender and non-distended with normal bowel sounds appreciated on auscultation.  Extremities: There is no pitting edema in the distal lower extremities bilaterally. Pedal pulses are intact.  Skin: Warm and dry without trophic changes noted. There are no varicose veins.  Musculoskeletal: exam reveals no obvious joint deformities, tenderness or joint swelling or erythema.   Neurologically:  Mental status: The patient is awake, alert and oriented in all 4 spheres. Her immediate and remote memory, attention, language skills and fund of knowledge are appropriate. There is no evidence of aphasia, agnosia, apraxia or anomia. Speech is clear with normal  prosody and enunciation. Thought process is linear. Mood is constricted and affect is blunted.  Cranial nerves II - XII are as described above under HEENT exam. In addition: shoulder shrug is normal with equal shoulder height noted. Motor exam: Normal bulk, strength and tone is noted. There is no drift, tremor or rebound. Romberg is negative. Reflexes are 2+ throughout. Babinski: Toes are flexor bilaterally. Fine motor skills and coordination: intact with normal finger taps, normal hand movements, normal rapid alternating patting, normal foot taps and normal foot agility.  Cerebellar testing: No dysmetria or intention tremor on finger to nose testing. Heel to shin is unremarkable bilaterally. There is no truncal or gait ataxia.  Sensory exam: intact to light touch in the upper and lower extremities.  Gait, station and balance: She stands easily. No veering to one side is noted. No leaning to one side is noted. Posture is age-appropriate and stance is narrow based. Gait shows normal stride length and normal pace. No problems turning are noted. Tandem walk is unremarkable.             Assessment and Plan:  In summary, Shellsea Wenzl is a very pleasant 35 y.o.-year old female with an underlying medical history of migraine headaches, anxiety, depression, PCOS, and obesity, whose history and physical exam are concerning for obstructive sleep apnea (OSA). I had a long chat with the patient about my findings and the diagnosis of OSA, its prognosis and treatment options. We talked about medical treatments, surgical interventions and non-pharmacological approaches. I explained in particular the risks and ramifications of untreated moderate to severe OSA, especially with respect to developing cardiovascular disease down the Road, including congestive heart failure, difficult to treat hypertension, cardiac arrhythmias, or stroke. Even type 2 diabetes has, in part, been linked to untreated OSA. Symptoms of untreated  OSA include daytime sleepiness, memory problems, mood irritability and mood disorder such as depression and anxiety, lack of energy, as well as recurrent headaches, especially morning headaches. We talked about smoking cessation and trying to maintain a healthy lifestyle in general, as well as the importance of weight control. I encouraged the patient to eat healthy, exercise daily and keep well hydrated, to keep a scheduled bedtime and wake time routine,  to not skip any meals and eat healthy snacks in between meals. I advised the patient not to drive when feeling sleepy. I recommended the following at this time: sleep study with potential positive airway pressure titration. (We will score hypopneas at 3% and split the sleep study into diagnostic and treatment portion, if the estimated. 2 hour AHI is >15/h).   I explained the sleep test procedure to the patient and also outlined possible surgical and non-surgical treatment options of OSA, including the use of a custom-made dental device (which would require a referral to a specialist dentist or oral surgeon), upper airway surgical options, such as pillar implants, radiofrequency surgery, tongue base surgery, and UPPP (which would involve a referral to an ENT surgeon). Rarely, jaw surgery such as mandibular advancement may be considered.  I also explained the CPAP treatment option to the patient, who indicated that she would be willing to try CPAP if the need arises. I explained the importance of being compliant with PAP treatment, not only for insurance purposes but primarily to improve Her symptoms, and for the patient's long term health benefit, including to reduce Her cardiovascular risks. I answered all her questions today and the patient was in agreement. I would like to see her back after the sleep study is completed and encouraged her to call with any interim questions, concerns, problems or updates.   Thank you very much for allowing me to participate  in the care of this nice patient. If I can be of any further assistance to you please do not hesitate to talk to me.  Sincerely,   Star Age, MD, PhD

## 2016-03-13 NOTE — Patient Instructions (Signed)

## 2016-03-31 ENCOUNTER — Institutional Professional Consult (permissible substitution): Payer: BLUE CROSS/BLUE SHIELD | Admitting: Neurology

## 2016-04-02 ENCOUNTER — Institutional Professional Consult (permissible substitution): Payer: BLUE CROSS/BLUE SHIELD | Admitting: Neurology

## 2016-04-08 ENCOUNTER — Ambulatory Visit (INDEPENDENT_AMBULATORY_CARE_PROVIDER_SITE_OTHER): Payer: BLUE CROSS/BLUE SHIELD | Admitting: Neurology

## 2016-04-08 DIAGNOSIS — G4710 Hypersomnia, unspecified: Secondary | ICD-10-CM | POA: Diagnosis not present

## 2016-04-08 DIAGNOSIS — R0683 Snoring: Secondary | ICD-10-CM

## 2016-04-08 DIAGNOSIS — G4720 Circadian rhythm sleep disorder, unspecified type: Secondary | ICD-10-CM

## 2016-04-18 ENCOUNTER — Telehealth: Payer: Self-pay | Admitting: Neurology

## 2016-04-18 NOTE — Progress Notes (Signed)
PATIENT'S NAME:  Heather Gardner, Alamin DOB:      Sep 17, 1980      MR#:    [id number]     DATE OF RECORDING: 04/08/2016 REFERRING M.D.:  Marylene Land Study Performed:   Baseline Polysomnogram HISTORY:  35 year old right-handed woman with an underlying medical history of migraine headaches, anxiety, depression, PCOS, and obesity, who reports snoring and excessive daytime somnolence. The patient endorsed the Epworth Sleepiness Scale at 11/24 points and the Fatigue Score at 61 points. The patient's weight 217 pounds with a height of 71 (inches), resulting in a BMI of 30.2 kg/m2. The patient's neck circumference measured 14.5 inches.  CURRENT MEDICATIONS: Buspar, Klonopin, Flonase, Neurontin, Iloperidone, Indocin, Lamictal, Mirena, Toprol, Zofran, Imitrex, Zomig.   PROCEDURE:  This is a multichannel digital polysomnogram utilizing the Somnostar 11.2 system.  Electrodes and sensors were applied and monitored per AASM Specifications.   EEG, EOG, Chin and Limb EMG, were sampled at 200 Hz.  ECG, Snore and Nasal Pressure, Thermal Airflow, Respiratory Effort, CPAP Flow and Pressure, Oximetry was sampled at 50 Hz. Digital video and audio were recorded.      BASELINE STUDY  Lights Out was at 22:21 and Lights On at 05:03.  Total recording time (TRT) was 394.5, with a total sleep time (TST) of 337.5 minutes.   The patient's sleep latency was 38.5 minutes, which is mildly increased.  REM latency was 70 minutes.  The sleep efficiency was 85.6 %.     SLEEP ARCHITECTURE: Stage N1 4.4%, Stage N2 76.9%, Stage N3 0% and Stage R (REM sleep) 18.7%, which is mildly reduced.   RESPIRATORY ANALYSIS:  Wake after sleep onset was 18.5 minutes, with very mild sleep fragmentation noted. There was a total of 2 respiratory events:  0 obstructive apneas, 0 central apneas and0 mixed apneas with a total of 0 apneas and an apnea index (AI) of 0. There were 2 hypopneas with a hypopnea index of .4. The patient also had 0 respiratory event  related arousals (RERAs).      The total APNEA/HYPOPNEA INDEX (AHI) was .4 and the total RESPIRATORY DISTURBANCE INDEX was .4.  2 events occurred in REM sleep and 0 events in NREM. The REM AHI was 1.9, versus a non-REM AHI of 0. The patient spent 25% of total sleep time in the supine position. The supine AHI was 0.7 versus a non-supine AHI of 0.2.  OXYGEN SATURATION & C02:  The baseline 02 saturation was 98%, with the lowest being 91%. Time spent below 89% saturation equaled 0 minutes.   Mild intermittent snoring was noted.   The video and audio analysis did not show any abnormal, or unusual behaviors, movements, phonations or vocalizations   PERIODIC LIMB MOVEMENTS:   The patient had a total of 6 Periodic Limb Movements.  The Periodic Limb Movement (PLM) index was 1.1 and the PLM Arousal index was 0.   IMPRESSION:  1.   Dysfunctions associated with sleep stages or arousal with sleep 2.   Primary snoring   RECOMMENDATIONS:  1. This study shows sleep fragmentation and abnormal sleep stage percentages; these are nonspecific findings and per se do not signify an intrinsic sleep disorder or a cause for the patient's sleep-related symptoms. Causes include (but are not limited to) the first night effect of the sleep study, circadian rhythm disturbances, medication effect or an underlying mood disorder or medical problem.  2. The patient should be cautioned not to drive, work at heights, or operate dangerous or heavy equipment when tired  or sleepy. Review and reiteration of good sleep hygiene measures should be pursued with any patient. 3. Mild snoring was noted, which may improve with non-supine sleep and weight loss.  4. Her referring provider will be notified of the test results.   I certify that I have reviewed the entire raw data recording prior to the issuance of this report in accordance with the Standards of Accreditation of the American Academy of Sleep Medicine (AASM)       Star Age, MD, PhD  Diplomat, American Board of Psychiatry and Neurology  Diplomat, [place] of [employer]

## 2016-04-18 NOTE — Telephone Encounter (Signed)
Patient referred by Dr. Leta Baptist, seen by me on 03/13/16, diagnostic PSG on 04/08/16.   Please call and notify the patient that the recent sleep study did not show any significant obstructive sleep apnea or intrinsic sleep disorder. Please inform patient that we can play it by ear or we can go over the details of the study during a follow up appointment. Arrange a followup appointment if desired. Also, route or fax report to PCP and referring MD, if other than PCP.  Once you have spoken to patient, you can close this encounter.   Thanks,  Star Age, MD, PhD [facility])

## 2016-04-22 NOTE — Telephone Encounter (Signed)
I spoke to patient and she is aware of results. She did not feel that it was necessary to make f/u appt. But was advised to call us back if she changes her mind. Results will be sent to referring doctor and PCP.

## 2016-05-20 ENCOUNTER — Ambulatory Visit: Payer: BLUE CROSS/BLUE SHIELD | Admitting: Diagnostic Neuroimaging

## 2016-05-28 ENCOUNTER — Ambulatory Visit: Payer: BLUE CROSS/BLUE SHIELD | Admitting: Diagnostic Neuroimaging

## 2016-06-02 ENCOUNTER — Encounter: Payer: Self-pay | Admitting: Diagnostic Neuroimaging

## 2016-06-02 NOTE — Telephone Encounter (Signed)
Patient called in to check the status of her email regarding the infusion. I relayed the previous information that Verneita Griffes RN had responded with. She said that she "must have just missed it" and I told her to wait for the call from infusion, she agreed.

## 2016-06-17 ENCOUNTER — Telehealth: Payer: Self-pay | Admitting: *Deleted

## 2016-06-17 NOTE — Telephone Encounter (Signed)
Called patient to confirm disability papers needed to be done again, last completed 02/29/16.  She stated she was uncertain , but that she knew papers had to be completed periodically. Called [employer] to confirm. Janett Billow stated papers do have to be completed to continue pt's long term disability, even though pt has not been seen since.

## 2016-06-18 NOTE — Telephone Encounter (Signed)
Papers on dr's desk for review, signature.

## 2016-06-19 NOTE — Telephone Encounter (Signed)
[employer] disability papers completed, sent to MR for processing.

## 2016-08-01 DIAGNOSIS — Z0271 Encounter for disability determination: Secondary | ICD-10-CM

## 2016-08-06 ENCOUNTER — Ambulatory Visit (INDEPENDENT_AMBULATORY_CARE_PROVIDER_SITE_OTHER): Payer: BLUE CROSS/BLUE SHIELD | Admitting: Diagnostic Neuroimaging

## 2016-08-06 ENCOUNTER — Encounter: Payer: Self-pay | Admitting: Diagnostic Neuroimaging

## 2016-08-06 VITALS — BP 109/77 | HR 84 | Wt 232.0 lb

## 2016-08-06 DIAGNOSIS — F39 Unspecified mood [affective] disorder: Secondary | ICD-10-CM

## 2016-08-06 DIAGNOSIS — D1802 Hemangioma of intracranial structures: Secondary | ICD-10-CM | POA: Diagnosis not present

## 2016-08-06 DIAGNOSIS — G43719 Chronic migraine without aura, intractable, without status migrainosus: Secondary | ICD-10-CM | POA: Diagnosis not present

## 2016-08-06 MED ORDER — TOPIRAMATE ER 100 MG PO CAP24: 100.0000 mg | ORAL_CAPSULE | Freq: Every day | ORAL | 4 refills | Status: DC

## 2016-08-06 MED ORDER — GABAPENTIN 300 MG PO CAPS: 300.0000 mg | ORAL_CAPSULE | Freq: Three times a day (TID) | ORAL | 4 refills | Status: DC

## 2016-08-06 MED ORDER — INDOMETHACIN 50 MG PO CAPS: 50.0000 mg | ORAL_CAPSULE | Freq: Two times a day (BID) | ORAL | 6 refills | Status: DC | PRN

## 2016-08-06 NOTE — Progress Notes (Signed)
[facility]  PATIENT: Heather Gardner DOB: 06/22/1981  REFERRING CLINICIAN: [name], MD HISTORY FROM: patient  REASON FOR VISIT: follow up   HISTORICAL  CHIEF COMPLAINT:  Chief Complaint  Patient presents with  . Migraine    rm 7, "keeping HA log, 18/month, 5 are crippling- headaches are basically same in number, type, pattern"  . Follow-up    6 month    HISTORY OF PRESENT ILLNESS:   UPDATE 08/06/16: Since last visit, doing about the same. Avg 18 HA per month (of these 5 are severe; more than half have migraine features; nausea, photophobia, smell and motion sensitivity). She has improved her nutrition, fitness (gym x 5 days per week), stopped caffeine, sleeping better. Unfortunately, HA still continue. Zomig not helping. Increased TPX not helping.  Indomethacin helps somewhat. Mood (esp depression) is better with current meds.   UPDATE 02/18/16: Since last visit, continues with headaches 16-20 days per month. (8-10 per month of frontal aching headache; 8-10 days of migraine with neck pain, throbbing, nausea and photophobia). Treating 10-15 days of headache per month. Using indomethacin 2x per day (10-15 days). Also on gabapentin 300mg  TID and Topiramate 100mg  qhs for migraine prevention.   UPDATE 11/19/15: Since last visit, doing worse with HA; could be related to worsening depression and hallucinations and interrupted sleep. Some snoring noted by spouse. Now with almost daily headaches. Now seeing new psychiatrist, and med adjustments are underway. Indomethacin not helping that much, but she uses it occasionally.   UPDATE 08/22/15: Since last visit, having new issues --> since Dec 2016 having intermittent visual hallucinations (spiders, people, animals). Has completed IOP program and now on abilify. Tolerating TPX ER 100mg  daily and gabapentin 300mg  daily. Still with 4-6 days HA per month (missing work).   UPDATE 10/19/14: Since last visit, continues with  intermittent HA (2 types as per last note). Tried TPX 50mg  BID without relief, then added gabapentin 300mg  daily (mild relief) and reduce TPX to 75mg  at bedtime. Occ uses rizatriptan, but saves it for severe HA only. Still missing up to 4 days of work per month. Some ipsilateral "heat" sensation with headache, but no lacrimation or nasal congestions. No caffeine or alcohol. She eats clean, few carbs, mainly veggies and protein. Sleeps 8-9 hrs per night. Lives with 1 child at home (who is autistic).  Reports history of venous angioma x 2 in the brain, dx'd many years ago.   PRIOR HPI (09/27/14): 36 year old right-handed female with polycystic ovarian syndrome, migraine, depression, here for evaluation of headaches. Patient referred for consultation by Dr. [name]. Patient developed migraine headaches in her 92s. She describes occipital, posterior, throbbing headaches with photophobia, phonophobia, nausea. Patient was able to control headaches in the past with over-the-counter medications and more recently with topiramate and Maxalt. Patient also has had nocturnal vertex headaches with severe vomiting, shaking sensation, slurred speech, burning sensation on the top of her head. Headaches improved in her late 91s and early 84s. Over past 1-2 months headaches have become more severe. She has missed 8 days of work recently due to severe headaches. She is again having 2 types of headaches. The more troublesome headaches are the nocturnal burning severe headaches. No specific triggering factors. Headaches seem to be worse in the springtime. She has been on antinausea medications, tramadol, ibuprofen and Tylenol without relief.   REVIEW OF SYSTEMS: Full 14 system review of systems performed and negative except for: depression anxiety hallucinations headaches.    ALLERGIES: Allergies  Allergen Reactions  .  Asa [Aspirin] Other (See Comments)    Patient stated that she can not take any blood thinners    HOME  MEDICATIONS: Outpatient Medications Prior to Visit  Medication Sig Dispense Refill  . clonazePAM (KLONOPIN) 1 MG tablet Take 1 mg by mouth 3 (three) times daily.    . fluticasone (FLONASE) 50 MCG/ACT nasal spray     . lamoTRIgine (LAMICTAL) 200 MG tablet Take 200 mg by mouth daily.  3  . levonorgestrel (MIRENA) 20 MCG/24HR IUD 1 each by Intrauterine route continuous.    . ondansetron (ZOFRAN) 4 MG tablet Take 1 tablet (4 mg total) by mouth every 8 (eight) hours as needed for nausea or vomiting. 30 tablet 6  . gabapentin (NEURONTIN) 300 MG capsule Take 1 capsule (300 mg total) by mouth 3 (three) times daily. 270 capsule 4  . indomethacin (INDOCIN) 50 MG capsule Take 1 capsule (50 mg total) by mouth 2 (two) times daily as needed. 60 capsule 6  . metoprolol (TOPROL-XL) 200 MG 24 hr tablet Take 200 mg by mouth daily.    Marland Kitchen zolmitriptan (ZOMIG) 5 MG nasal solution Place 1 spray into the nose as needed for migraine (maximum 1 spray per day). 6 Units 6  . busPIRone (BUSPAR) 15 MG tablet Take 10 mg by mouth daily.     . Iloperidone 8 MG TABS Take 1 tablet by mouth daily.    Marland Kitchen lamoTRIgine (LAMICTAL) 25 MG tablet Take 20 mg by mouth daily.    . SUMAtriptan (IMITREX) 20 MG/ACT nasal spray Place 1 spray (20 mg total) into the nose as needed for migraine or headache. May repeat in 2 hours; max 2 doses per day or 8 per month. 8 Inhaler 6   No facility-administered medications prior to visit.     PAST MEDICAL HISTORY: Past Medical History:  Diagnosis Date  . Anxiety   . Headache   . Kidney infection   . Major depression   . PCOS (polycystic ovarian syndrome)     PAST SURGICAL HISTORY: Past Surgical History:  Procedure Laterality Date  . WISDOM TOOTH EXTRACTION  08/2015    FAMILY HISTORY: Family History  Problem Relation Age of Onset  . Heart disease Mother   . Cancer Mother     skin  . Alcohol abuse Mother   . Depression Mother   . Prostate cancer Father   . Throat cancer Father   .  Cancer Father     skin  . Alcohol abuse Father   . Drug abuse Father   . Depression Father   . Bipolar disorder Sister   . Anxiety disorder Brother   . Kidney failure Maternal Grandmother   . Brain cancer Maternal Grandfather   . Lung cancer Maternal Grandfather   . Autism Son   . Anxiety disorder Paternal Grandmother     SOCIAL HISTORY:  Social History   Social History  . Marital status: Married    Spouse name: N/A  . Number of children: 1  . Years of education: N/A   Occupational History  . [place] History Main Topics  . Smoking status: Current Some Day Smoker    Packs/day: 0.25    Types: Cigarettes  . Smokeless tobacco: Never Used     Comment: 08/22/15 smoke when stressed  . Alcohol use No  . Drug use: No  . Sexual activity: Yes    Birth control/ protection: IUD   Other Topics Concern  . Not on file  Social History Narrative  . No narrative on file     PHYSICAL EXAM  Vitals:   08/06/16 0803  BP: 109/77  Pulse: 84  Weight: 232 lb (105.2 kg)   Wt Readings from Last 3 Encounters:  08/06/16 232 lb (105.2 kg)  03/13/16 217 lb (98.4 kg)  02/18/16 227 lb 9.6 oz (103.2 kg)   Body mass index is 32.36 kg/m.  No exam data present  No flowsheet data found.  GENERAL EXAM: Patient is in no distress; well developed, nourished and groomed; neck is supple  CARDIOVASCULAR: Regular rate and rhythm, no murmurs, no carotid bruits  NEUROLOGIC: MENTAL STATUS: awake, alert, language fluent, comprehension intact, naming intact, fund of knowledge appropriate CRANIAL NERVE: no papilledema on fundoscopic exam, pupils equal and reactive to light, visual fields full to confrontation, extraocular muscles intact, no nystagmus, facial sensation and strength symmetric, hearing intact, palate elevates symmetrically, uvula midline, shoulder shrug symmetric, tongue midline. MOTOR: normal bulk and tone, full strength in the BUE, BLE SENSORY: normal  and symmetric to light touch  COORDINATION: finger-nose-finger, fine finger movements normal REFLEXES: deep tendon reflexes present and symmetric GAIT/STATION: narrow based gait; romberg is negative    DIAGNOSTIC DATA (LABS, IMAGING, TESTING) - I reviewed patient records, labs, notes, testing and imaging myself where available.  Lab Results  Component Value Date   WBC 6.9 05/10/2012   HGB 14.2 05/10/2012   HCT 41.1 05/10/2012   MCV 84.4 05/10/2012   PLT 223 05/10/2012      Component Value Date/Time   NA 135 05/10/2012 0230   K 3.4 (L) 05/10/2012 0230   CL 101 05/10/2012 0230   CO2 24 05/10/2012 0230   GLUCOSE 80 05/10/2012 0230   BUN 13 05/10/2012 0230   CREATININE 0.56 05/10/2012 0230   CALCIUM 9.3 05/10/2012 0230   PROT 7.4 05/10/2012 0230   ALBUMIN 3.9 05/10/2012 0230   AST 22 05/10/2012 0230   ALT 25 05/10/2012 0230   ALKPHOS 68 05/10/2012 0230   BILITOT 0.3 05/10/2012 0230   GFRNONAA >90 05/10/2012 0230   GFRAA >90 05/10/2012 0230   No results found for: CHOL, HDL, LDLCALC, LDLDIRECT, TRIG, CHOLHDL No results found for: HGBA1C No results found for: VITAMINB12 No results found for: TSH  March 2016 MRI brain ([facility]) - normal per referring MD notes  10/26/15 MRI brain 1. There are no acute findings. 2. A right frontal draining vein is mildly enlarged though there are no significant developmental venous anomalies.This is unlikely to be clinically significant.    ASSESSMENT AND PLAN  36 y.o. year old female here with history of migraine since 2000's, now with increasing headaches. Normal neuro exam and MRI brain. Likely represents chronic migraine without aura. Paroxysmal hemicrania or other trigeminal autonomic cephalgia is possible.   Tried and failed: topiramate, gabapentin, rizatriptan, zomig nasal spray, indomethacin   Dx:  Intractable chronic migraine without aura and without status migrainosus  Mood disorder (HCC)  Venous  angioma of brain (HCC)     PLAN: - continue gabapentin 300mg  three times per day - reduce topiramate ER to 100mg  in the evening - continue indomethacin 50mg  as needed for breakthrough headaches - stop zomig nasal spray - will ask my colleagues for second opinion and consideration of botox for migraine  Meds ordered this encounter  Medications  . Topiramate ER 100 MG CP24    Sig: Take 100 mg by mouth at bedtime.    Dispense:  90 capsule    Refill:  4  . gabapentin (NEURONTIN) 300 MG capsule    Sig: Take 1 capsule (300 mg total) by mouth 3 (three) times daily.    Dispense:  270 capsule    Refill:  4  . indomethacin (INDOCIN) 50 MG capsule    Sig: Take 1 capsule (50 mg total) by mouth 2 (two) times daily as needed.    Dispense:  60 capsule    Refill:  6   Return in about 2 months (around 10/04/2016) for second opinion with Dr. Jaynee Eagles (headache consult; consideration for botox for migraine).    Penni Bombard, MD [id number], [id number] AM Certified in Neurology, Neurophysiology and Neuroimaging  [facility] [address] [place], [place] [address] [phone]

## 2016-08-06 NOTE — Patient Instructions (Signed)
Reduce topiramate to 100mg  daily.  Continue gabapentin and indomethacin.  Stop zomig.  I will setup second opinion headache consult with Dr. Jaynee Eagles.

## 2016-08-07 ENCOUNTER — Telehealth: Payer: Self-pay | Admitting: *Deleted

## 2016-08-07 NOTE — Telephone Encounter (Signed)
[employer] [place] disability papers completed, reviewed, signed by Dr Leta Baptist. Sent to MR for processing.

## 2016-08-07 NOTE — Telephone Encounter (Signed)
[employer] [place] disability papers on Dr [employer] desk for review, signature.

## 2016-08-19 DIAGNOSIS — Z0289 Encounter for other administrative examinations: Secondary | ICD-10-CM

## 2016-09-03 ENCOUNTER — Ambulatory Visit (HOSPITAL_COMMUNITY)
Admission: EM | Admit: 2016-09-03 | Discharge: 2016-09-03 | Disposition: A | Discharge status: Home / Self Care | Payer: BLUE CROSS/BLUE SHIELD | Attending: Family | Admitting: Family

## 2016-09-03 ENCOUNTER — Encounter (HOSPITAL_COMMUNITY): Payer: Self-pay | Admitting: *Deleted

## 2016-09-03 DIAGNOSIS — R1032 Left lower quadrant pain: Secondary | ICD-10-CM

## 2016-09-03 DIAGNOSIS — Z8742 Personal history of other diseases of the female genital tract: Secondary | ICD-10-CM

## 2016-09-03 MED ORDER — KETOROLAC TROMETHAMINE 60 MG/2ML IM SOLN
INTRAMUSCULAR | Status: AC
  Filled 2016-09-03: qty 2

## 2016-09-03 MED ORDER — KETOROLAC TROMETHAMINE 60 MG/2ML IM SOLN
60.0000 mg | Freq: Once | INTRAMUSCULAR | Status: AC
  Administered 2016-09-03: 60 mg via INTRAMUSCULAR

## 2016-09-03 MED ORDER — HYDROCODONE-ACETAMINOPHEN 5-325 MG PO TABS: 1.0000 | ORAL_TABLET | Freq: Four times a day (QID) | ORAL | 0 refills | Status: DC | PRN

## 2016-09-03 NOTE — ED Triage Notes (Signed)
Pt  Reports  A  History  Of     Ovarian   Cyst   She  Reports  Low  abd  Pain    With  Some  Nausea     She  Denies  Any     Nausea   Vag    Symptoms    Symptoms  X    2  Days

## 2016-09-03 NOTE — ED Provider Notes (Signed)
CSN: [id number]     Arrival date & time 09/03/16  1841 History   First MD Initiated Contact with Patient 09/03/16 1954     Chief Complaint  Patient presents with  . Abdominal Pain   (Consider location/radiation/quality/duration/timing/severity/associated sxs/prior Treatment) 36 year old female presents with left lower pelvic/abdominal pain that has been present for the past 2 to 3 days. Also feeling nauseous but no vomiting or diarrhea. Denies any fever, urinary symptoms or change in bowel pattern. She took Zofran which is helping with nausea and Advil which is not helping with pain. She has a Mirena IUD in place and no longer has any bleeding. She denies any vaginal discharge or change in partner.  She has a history of a left ovarian cyst which was dx in 12/2015 and her GYN is continuing to monitor. She has an appointment with them on next Tuesday 3/13 but unable to manage pain. She is on multiple medications for depression, anxiety and PCOS and continues to smoke daily.    The history is provided by the patient.    Past Medical History:  Diagnosis Date  . Anxiety   . Headache   . Kidney infection   . Major depression   . PCOS (polycystic ovarian syndrome)    Past Surgical History:  Procedure Laterality Date  . WISDOM TOOTH EXTRACTION  08/2015   Family History  Problem Relation Age of Onset  . Heart disease Mother   . Cancer Mother     skin  . Alcohol abuse Mother   . Depression Mother   . Prostate cancer Father   . Throat cancer Father   . Cancer Father     skin  . Alcohol abuse Father   . Drug abuse Father   . Depression Father   . Bipolar disorder Sister   . Anxiety disorder Brother   . Kidney failure Maternal Grandmother   . Brain cancer Maternal Grandfather   . Lung cancer Maternal Grandfather   . Autism Son   . Anxiety disorder Paternal Grandmother    Social History  Substance Use Topics  . Smoking status: Current Some Day Smoker    Packs/day: 0.25    Types:  Cigarettes  . Smokeless tobacco: Never Used     Comment: 08/22/15 smoke when stressed  . Alcohol use No   OB History    Gravida Para Term Preterm AB Living   1 1 1     1    SAB TAB Ectopic Multiple Live Births           1     Review of Systems  Constitutional: Positive for appetite change. Negative for chills, fatigue and fever.  HENT: Negative for congestion and sore throat.   Respiratory: Negative for cough, chest tightness and shortness of breath.   Cardiovascular: Negative for chest pain.  Gastrointestinal: Positive for abdominal pain and nausea. Negative for diarrhea and vomiting.  Genitourinary: Positive for pelvic pain. Negative for decreased urine volume, difficulty urinating, dysuria, flank pain, genital sores, hematuria, vaginal bleeding, vaginal discharge and vaginal pain.  Musculoskeletal: Negative for arthralgias, back pain, myalgias and neck pain.  Skin: Negative for rash and wound.  Neurological: Negative for dizziness, syncope, weakness, light-headedness, numbness and headaches.  Hematological: Negative for adenopathy.    Allergies  Asa [aspirin]  Home Medications   Prior to Admission medications   Medication Sig Start Date End Date Taking? Authorizing Provider  carbamazepine (TEGRETOL) 100 MG chewable tablet Chew 100 mg by mouth at  bedtime. 07/21/16   Historical Provider, MD  Cholecalciferol (VITAMIN D3) 50000 units CAPS 50,000 Units every 7 (seven) days. 08/01/16   Historical Provider, MD  clonazePAM (KLONOPIN) 1 MG tablet Take 1 mg by mouth 3 (three) times daily.    Historical Provider, MD  fluticasone Asencion Islam) 50 MCG/ACT nasal spray  08/19/15   Historical Provider, MD  gabapentin (NEURONTIN) 300 MG capsule Take 1 capsule (300 mg total) by mouth 3 (three) times daily. 08/06/16   Penni Bombard, MD  HYDROcodone-acetaminophen (NORCO/VICODIN) 5-325 MG tablet Take 1-2 tablets by mouth every 6 (six) hours as needed for severe pain. 09/03/16   Katy Apo, NP    indomethacin (INDOCIN) 50 MG capsule Take 1 capsule (50 mg total) by mouth 2 (two) times daily as needed. 08/06/16   Penni Bombard, MD  lamoTRIgine (LAMICTAL) 200 MG tablet Take 200 mg by mouth daily. 03/05/16   Historical Provider, MD  levonorgestrel (MIRENA) 20 MCG/24HR IUD 1 each by Intrauterine route continuous.    Historical Provider, MD  OLANZapine (ZYPREXA) 10 MG tablet 10 mg daily. 07/22/16   Historical Provider, MD  ondansetron (ZOFRAN) 4 MG tablet Take 1 tablet (4 mg total) by mouth every 8 (eight) hours as needed for nausea or vomiting. 02/18/16   Penni Bombard, MD  Topiramate ER 100 MG CP24 Take 100 mg by mouth at bedtime. 08/06/16   Penni Bombard, MD   Meds Ordered and Administered this Visit   Medications  ketorolac (TORADOL) injection 60 mg (60 mg Intramuscular Given 09/03/16 2038)    BP 130/78 (BP Location: Right Arm)   Pulse 78   Temp 98.6 F (37 C) (Oral)   Resp 18   SpO2 100%  No data found.   Physical Exam  Constitutional: She is oriented to person, place, and time. She appears well-developed and well-nourished. No distress.  HENT:  Head: Normocephalic and atraumatic.  Right Ear: External ear normal.  Left Ear: External ear normal.  Nose: Nose normal.  Neck: Normal range of motion. Neck supple.  Cardiovascular: Normal rate, regular rhythm and normal heart sounds.   Pulmonary/Chest: Effort normal and breath sounds normal. No respiratory distress.  Abdominal: Soft. Normal appearance and bowel sounds are normal. There is tenderness in the left lower quadrant. There is no rigidity, no rebound, no guarding and no CVA tenderness.    Genitourinary:  Genitourinary Comments: Patient declines pelvic exam  Musculoskeletal: Normal range of motion.  Neurological: She is alert and oriented to person, place, and time.  Skin: Skin is warm and dry. Capillary refill takes less than 2 seconds.  Psychiatric: She has a normal mood and affect. Her behavior is normal.  Judgment and thought content normal.    Urgent Care Course     Procedures (including critical care time)  Labs Review Labs Reviewed - No data to display  Imaging Review No results found.   Visual Acuity Review  Right Eye Distance:   Left Eye Distance:   Bilateral Distance:    Right Eye Near:   Left Eye Near:    Bilateral Near:         MDM   1. Left lower quadrant pain   2. History of ovarian cyst    Discussed that her pain may have multiple causes. Since pain does not change with urination, did not perform an urinalysis. Reviewed that due to history and clinical findings, she most likely has an ovarian cyst. Gave Toradol 60mg  IM now to help with  pain and inflammation. May take OTC Aleve 2 tablets every 12 hours as needed for pain. If pain continues, may try Vicodin 5/325mg  1 to 2 tablets every 6 hours as needed #12 no refill. [place] Controlled Substance Registry reviewed- only active Rx in the past 6 months is Klonopin. Recommend follow-up with her GYN on 3/13 as planned or go to ER if pain becomes more severe.      Katy Apo, NP 09/03/16 [phone]

## 2016-09-03 NOTE — Discharge Instructions (Signed)
You were given a shot of Toradol today to help with pain and inflammation. Recommend take Vicodin 1 to 2 tablets every 6 hours as needed for pain. May continue Ibuprofen or Aleve every 12 hours as needed. Follow-up with your GYN on Tuesday 3/13 as planned or go to ER if pain becomes more severe.

## 2016-09-09 ENCOUNTER — Ambulatory Visit (INDEPENDENT_AMBULATORY_CARE_PROVIDER_SITE_OTHER): Payer: BLUE CROSS/BLUE SHIELD | Admitting: Family Medicine

## 2016-09-09 ENCOUNTER — Encounter: Payer: Self-pay | Admitting: Family Medicine

## 2016-09-09 DIAGNOSIS — N83202 Unspecified ovarian cyst, left side: Secondary | ICD-10-CM

## 2016-09-09 DIAGNOSIS — N83209 Unspecified ovarian cyst, unspecified side: Secondary | ICD-10-CM | POA: Insufficient documentation

## 2016-09-09 NOTE — Assessment & Plan Note (Signed)
If no change since last formal imaging, consider cyst removal. Patient indicated she wanted BSO, but we have discussed impact on longevity and increasing mortality.

## 2016-09-09 NOTE — Progress Notes (Signed)
F/U ED visit - possible ovarian cyst.

## 2016-09-09 NOTE — Patient Instructions (Signed)
Pelvic Pain, Female °Pelvic pain is pain in your lower belly (abdomen), below your belly button and between your hips. The pain may start suddenly (acute), keep coming back (recurring), or last a long time (chronic). Pelvic pain that lasts longer than six months is considered chronic. There are many causes of pelvic pain. Sometimes the cause of your pelvic pain is not known. °Follow these instructions at home: °· Take over-the-counter and prescription medicines only as told by your doctor. °· Rest as told by your doctor. °· Do not have sex it if hurts. °· Keep a journal of your pelvic pain. Write down: °¨ When the pain started. °¨ Where the pain is located. °¨ What seems to make the pain better or worse, such as food or your menstrual cycle. °¨ Any symptoms you have along with the pain. °· Keep all follow-up visits as told by your doctor. This is important. °Contact a doctor if: °· Medicine does not help your pain. °· Your pain comes back. °· You have new symptoms. °· You have unusual vaginal discharge or bleeding. °· You have a fever or chills. °· You are having a hard time pooping (constipation). °· You have blood in your pee (urine) or poop (stool). °· Your pee smells bad. °· You feel weak or lightheaded. °Get help right away if: °· You have sudden pain that is very bad. °· Your pain continues to get worse. °· You have very bad pain and also have any of the following symptoms: °¨ A fever. °¨ Feeling stick to your stomach (nausea). °¨ Throwing up (vomiting). °¨ Being very sweaty. °· You pass out (lose consciousness). °This information is not intended to replace advice given to you by your health care provider. Make sure you discuss any questions you have with your health care provider. °Document Released: 12/03/2007 Document Revised: 07/11/2015 Document Reviewed: 04/06/2015 °Elsevier Interactive Patient Education © 2017 Elsevier Inc. ° °

## 2016-09-09 NOTE — Progress Notes (Signed)
   Subjective:    Patient ID: Heather Gardner is a 36 y.o. female presenting with Ovarian Cyst  on 09/09/2016  HPI: Has long h/o ovarian cysts. Went to urgent care due to no appointment and significant pain. Got pain meds there. Notes that pain got better yesterday. Denies fever, chills. Notes no appetite and nausea. Felt like when she had fluid in her fallopian tube. Notes comes to MD for pain meds and has to see the MD. Has IUD in place. Has Mirena in x 7 years.  Review of Systems  Constitutional: Negative for chills and fever.  Respiratory: Negative for shortness of breath.   Cardiovascular: Negative for chest pain.  Gastrointestinal: Negative for abdominal pain, nausea and vomiting.  Genitourinary: Negative for dysuria.  Skin: Negative for rash.      Objective:    BP 106/73   Pulse 82   Ht 5\' 10"  (1.778 m)   Wt 227 lb (103 kg)   BMI 32.57 kg/m  Physical Exam  Constitutional: She is oriented to person, place, and time. She appears well-developed and well-nourished. No distress.  HENT:  Head: Normocephalic and atraumatic.  Eyes: No scleral icterus.  Neck: Neck supple.  Cardiovascular: Normal rate.   Pulmonary/Chest: Effort normal.  Abdominal: Soft. She exhibits no mass. There is tenderness (mild tenderness). There is no rebound and no guarding.  Neurological: She is alert and oriented to person, place, and time.  Skin: Skin is warm and dry.  Psychiatric: She has a normal mood and affect.        Assessment & Plan:   Problem List Items Addressed This Visit      Unprioritized   Ovarian cyst, left    If no change since last formal imaging, consider cyst removal. Patient indicated she wanted BSO, but we have discussed impact on longevity and increasing mortality.      Relevant Orders   US Pelvis Complete   US Transvaginal Non-OB      Total face-to-face time with patient: 15 minutes. Over 50% of encounter was spent on counseling and coordination of care. Return in  about 3 months (around 12/10/2016).  Donnamae Jude 09/09/2016 10:15 AM

## 2016-09-16 ENCOUNTER — Ambulatory Visit (HOSPITAL_COMMUNITY)
Admission: RE | Admit: 2016-09-16 | Discharge: 2016-09-16 | Disposition: A | Discharge status: Home / Self Care | Payer: BLUE CROSS/BLUE SHIELD | Source: Ambulatory Visit | Attending: Family Medicine | Admitting: Family Medicine

## 2016-09-16 DIAGNOSIS — N83202 Unspecified ovarian cyst, left side: Secondary | ICD-10-CM

## 2016-09-16 DIAGNOSIS — Z975 Presence of (intrauterine) contraceptive device: Secondary | ICD-10-CM | POA: Diagnosis not present

## 2016-10-06 ENCOUNTER — Ambulatory Visit (INDEPENDENT_AMBULATORY_CARE_PROVIDER_SITE_OTHER): Payer: BLUE CROSS/BLUE SHIELD | Admitting: Neurology

## 2016-10-06 ENCOUNTER — Encounter: Payer: Self-pay | Admitting: Neurology

## 2016-10-06 DIAGNOSIS — G43709 Chronic migraine without aura, not intractable, without status migrainosus: Secondary | ICD-10-CM | POA: Insufficient documentation

## 2016-10-06 DIAGNOSIS — G43009 Migraine without aura, not intractable, without status migrainosus: Secondary | ICD-10-CM | POA: Insufficient documentation

## 2016-10-06 MED ORDER — PROPRANOLOL HCL 20 MG PO TABS: 20.0000 mg | ORAL_TABLET | Freq: Two times a day (BID) | ORAL | 6 refills | Status: DC

## 2016-10-06 NOTE — Progress Notes (Signed)
[facility]    Provider:  Dr Jaynee Eagles Referring Provider: Cari Caraway, MD Primary Care Physician:  Cari Caraway, MD  CC:  Migraines  HPI:  Heather Gardner is a 36 y.o. female here as a referral from Dr. Leta Baptist for intractable headaches.PMHx PCOS, depression. She has been having 26/30 days a month with headache for at least the last 6 months.16-17 a month are migrainous. Her migraines are more on the right side, pounding/throbbing, with nausea and vomiting, light sensitivity, motion worsens her migraines, going into a dark room and laying still really helps. They last 6-12 hours. She can wake with headaches that last for days. She has nocturnal; headaches on the right side around the eye or at the base of her neck. The migraines can be severe 10/10 pain. They have been so painful her husband thought she was having a stroke. On average 6/10 but often severe. No aura. No medication overuse or OTC medication use. Triptans did not work at all.   Medications tried: TPX, Indomethacin, Neurontin, Maxalt, Lamictal, buspar, lexapro, metoprolol, zofran, imitre nasal spray, zomig  Reviewed notes, labs and imaging from outside physicians, which showed:  Reviewed MRI of the brain April 2017 images and agree with the following:  IMPRESSION:  This MRI of the brain with and without contrast shows the following: 1.    There are no acute findings. 2.    A right frontal draining vein is mildly enlarged though there are no significant developmental venous anomalies.   this is unlikely to be clinically significant.  Review of Systems: Patient complains of symptoms per HPI as well as the following symptoms:No CP, no SOB. Pertinent negatives per HPI. All others negative.   Social History   Social History  . Marital status: Married    Spouse name: N/A  . Number of children: 1  . Years of education: N/A   Occupational History  . [employer]  Topics  . Smoking status: Former Smoker    Packs/day: 0.25    Types: Cigarettes    Quit date: 06/30/2016  . Smokeless tobacco: Never Used     Comment: 08/22/15 smoke when stressed  . Alcohol use No  . Drug use: No  . Sexual activity: Yes    Birth control/ protection: IUD   Other Topics Concern  . Not on file   Social History Narrative   Lives at home w/ her husband and son   Right-handed   Caffeine: rare    Family History  Problem Relation Age of Onset  . Heart disease Mother   . Cancer Mother     skin  . Alcohol abuse Mother   . Depression Mother   . Prostate cancer Father   . Throat cancer Father   . Cancer Father     skin  . Alcohol abuse Father   . Drug abuse Father   . Depression Father   . Bipolar disorder Sister   . Anxiety disorder Brother   . Kidney failure Maternal Grandmother   . Brain cancer Maternal Grandfather   . Lung cancer Maternal Grandfather   . Autism Son   . Anxiety disorder Paternal Grandmother     Past Medical History:  Diagnosis Date  . Anxiety   . Headache   . Kidney infection   . Major depression   . PCOS (polycystic ovarian syndrome)     Past Surgical History:  Procedure Laterality Date  . WISDOM TOOTH EXTRACTION  08/2015  Current Outpatient Prescriptions  Medication Sig Dispense Refill  . Cholecalciferol (VITAMIN D3) 50000 units CAPS 50,000 Units every 7 (seven) days.  11  . clonazePAM (KLONOPIN) 1 MG tablet Take 1 mg by mouth 3 (three) times daily.    . fluticasone (FLONASE) 50 MCG/ACT nasal spray     . gabapentin (NEURONTIN) 300 MG capsule Take 1 capsule (300 mg total) by mouth 3 (three) times daily. 270 capsule 4  . indomethacin (INDOCIN) 50 MG capsule Take 1 capsule (50 mg total) by mouth 2 (two) times daily as needed. 60 capsule 6  . lamoTRIgine (LAMICTAL) 200 MG tablet Take 200 mg by mouth daily.  3  . levonorgestrel (MIRENA) 20 MCG/24HR IUD 1 each by Intrauterine route continuous.    Marland Kitchen OLANZapine-FLUoxetine (SYMBYAX)  12-25 MG capsule Take 1 capsule by mouth at bedtime.  5  . ondansetron (ZOFRAN) 4 MG tablet Take 1 tablet (4 mg total) by mouth every 8 (eight) hours as needed for nausea or vomiting. 30 tablet 6  . Topiramate ER 100 MG CP24 Take 100 mg by mouth at bedtime. 90 capsule 4  . propranolol (INDERAL) 20 MG tablet Take 1 tablet (20 mg total) by mouth 2 (two) times daily. 60 tablet 6   No current facility-administered medications for this visit.     Allergies as of 10/06/2016 - Review Complete 10/06/2016  Allergen Reaction Noted  . Asa [aspirin] Other (See Comments) 05/05/2012    Vitals: BP 115/71   Pulse 85   Ht 5\' 10"  (1.778 m)   Wt 234 lb 6.4 oz (106.3 kg)   BMI 33.63 kg/m  Last Weight:  Wt Readings from Last 1 Encounters:  10/06/16 234 lb 6.4 oz (106.3 kg)   Last Height:   Ht Readings from Last 1 Encounters:  10/06/16 5\' 10"  (1.778 m)    Physical exam: Exam: Gen: NAD, conversant, well nourised, obese, well groomed                     CV: RRR, no MRG. No Carotid Bruits. No peripheral edema, warm, nontender Eyes: Conjunctivae clear without exudates or hemorrhage  Neuro: Detailed Neurologic Exam  Speech:    Speech is normal; fluent and spontaneous with normal comprehension.  Cognition:    The patient is oriented to person, place, and time;     recent and remote memory intact;     language fluent;     normal attention, concentration,     fund of knowledge Cranial Nerves:    The pupils are equal, round, and reactive to light. The fundi are normal and spontaneous venous pulsations are present. Visual fields are full to finger confrontation. Extraocular movements are intact. Trigeminal sensation is intact and the muscles of mastication are normal. The face is symmetric. The palate elevates in the midline. Hearing intact. Voice is normal. Shoulder shrug is normal. The tongue has normal motion without fasciculations.   Coordination:    No dysmetria  Motor Observation:    No  asymmetry, no atrophy, and no involuntary movements noted. Tone:    Normal muscle tone.    Posture:    Posture is normal. normal erect    Strength:    Strength is V/V in the upper and lower limbs.       Assessment/Plan:  36 year old patient with chronic migraines without aura and without status migrainosus not intractable who is here for evaluation. She is failed multiple medications and continues to have a high burden of almost daily  headaches. At this point I feel that Botox migraine is appropriate especially given she has tried multiple classes of medications including: TPX, Indomethacin, Neurontin, Maxalt, Lamictal, buspar, lexapro, metoprolol, zofran, imitrex nasal spray, zomig. Discussed Botox and provided literature for patient to read. We'll recommend approval from insurance. We'll also try propranolol 20 mg twice daily but cautioned about low blood pressure and to stop for any side effects especially dizziness, chest pain, fatigue or any other side effects.  Sarina Ill, MD  [facility] [address] [place], [place] [address]  Phone [phone] Fax [phone]  A total of 30 minutes was spent in with this patient face-to-face. Over half this time was spent on counseling patient on the migraine diagnosis and different therapeutic options available.

## 2016-10-06 NOTE — Patient Instructions (Signed)
Remember to drink plenty of fluid, eat healthy meals and do not skip any meals. Try to eat protein with a every meal and eat a healthy snack such as fruit or nuts in between meals. Try to keep a regular sleep-wake schedule and try to exercise daily, particularly in the form of walking, 20-30 minutes a day, if you can.   As far as your medications are concerned, I would like to suggest: propranolol 20mg  twice daily watch for dizziness, chest pain, fatigue and other side effects (see below)  I would like to see you back for botox, sooner if we need to. Please call us with any interim questions, concerns, problems, updates or refill requests.   Our phone number is [phone]. We also have an after hours call service for urgent matters and there is a physician on-call for urgent questions. For any emergencies you know to call 911 or go to the nearest emergency room  Propranolol tablets What is this medicine? PROPRANOLOL (proe PRAN oh lole) is a beta-blocker. Beta-blockers reduce the workload on the heart and help it to beat more regularly. This medicine is used to treat high blood pressure, to control irregular heart rhythms (arrhythmias) and to relieve chest pain caused by angina. It may also be helpful after a heart attack. This medicine is also used to prevent migraine headaches, relieve uncontrollable shaking (tremors), and help certain problems related to the thyroid gland and adrenal gland. This medicine may be used for other purposes; ask your health care provider or pharmacist if you have questions. COMMON BRAND NAME(S): Inderal What should I tell my health care provider before I take this medicine? They need to know if you have any of these conditions: -circulation problems or blood vessel disease -diabetes -history of heart attack or heart disease, vasospastic angina -kidney disease -liver disease -lung or breathing disease, like asthma or emphysema -pheochromocytoma -slow heart  rate -thyroid disease -an unusual or allergic reaction to propranolol, other beta-blockers, medicines, foods, dyes, or preservatives -pregnant or trying to get pregnant -breast-feeding How should I use this medicine? Take this medicine by mouth with a glass of water. Follow the directions on the prescription label. Take your doses at regular intervals. Do not take your medicine more often than directed. Do not stop taking except on your the advice of your doctor or health care professional. Talk to your pediatrician regarding the use of this medicine in children. Special care may be needed. Overdosage: If you think you have taken too much of this medicine contact a poison control center or emergency room at once. NOTE: This medicine is only for you. Do not share this medicine with others. What if I miss a dose? If you miss a dose, take it as soon as you can. If it is almost time for your next dose, take only that dose. Do not take double or extra doses. What may interact with this medicine? Do not take this medicine with any of the following medications: -feverfew -phenothiazines like chlorpromazine, mesoridazine, prochlorperazine, thioridazine This medicine may also interact with the following medications: -aluminum hydroxide gel -antipyrine -antiviral medicines for HIV or AIDS -barbiturates like phenobarbital -certain medicines for blood pressure, heart disease, irregular heart beat -cimetidine -ciprofloxacin -diazepam -fluconazole -haloperidol -isoniazid -medicines for cholesterol like cholestyramine or colestipol -medicines for mental depression -medicines for migraine headache like almotriptan, eletriptan, frovatriptan, naratriptan, rizatriptan, sumatriptan, zolmitriptan -NSAIDs, medicines for pain and inflammation, like ibuprofen or naproxen -phenytoin -rifampin -teniposide -theophylline -thyroid medicines -tolbutamide -warfarin -zileuton This  list may not describe all  possible interactions. Give your health care provider a list of all the medicines, herbs, non-prescription drugs, or dietary supplements you use. Also tell them if you smoke, drink alcohol, or use illegal drugs. Some items may interact with your medicine. What should I watch for while using this medicine? Visit your doctor or health care professional for regular check ups. Check your blood pressure and pulse rate regularly. Ask your health care professional what your blood pressure and pulse rate should be, and when you should contact them. You may get drowsy or dizzy. Do not drive, use machinery, or do anything that needs mental alertness until you know how this drug affects you. Do not stand or sit up quickly, especially if you are an older patient. This reduces the risk of dizzy or fainting spells. Alcohol can make you more drowsy and dizzy. Avoid alcoholic drinks. This medicine can affect blood sugar levels. If you have diabetes, check with your doctor or health care professional before you change your diet or the dose of your diabetic medicine. Do not treat yourself for coughs, colds, or pain while you are taking this medicine without asking your doctor or health care professional for advice. Some ingredients may increase your blood pressure. What side effects may I notice from receiving this medicine? Side effects that you should report to your doctor or health care professional as soon as possible: -allergic reactions like skin rash, itching or hives, swelling of the face, lips, or tongue -breathing problems -changes in blood sugar -cold hands or feet -difficulty sleeping, nightmares -dry peeling skin -hallucinations -muscle cramps or weakness -slow heart rate -swelling of the legs and ankles -vomiting Side effects that usually do not require medical attention (report to your doctor or health care professional if they continue or are bothersome): -change in sex drive or  performance -diarrhea -dry sore eyes -hair loss -nausea -weak or tired This list may not describe all possible side effects. Call your doctor for medical advice about side effects. You may report side effects to FDA at 1-800-FDA-1088. Where should I keep my medicine? Keep out of the reach of children. Store at room temperature between 15 and 30 degrees C (59 and 86 degrees F). Protect from light. Throw away any unused medicine after the expiration date. NOTE: This sheet is a summary. It may not cover all possible information. If you have questions about this medicine, talk to your doctor, pharmacist, or health care provider.  2018 Elsevier/Gold Standard (2013-02-18 14:51:53)   Botulinum Toxin Cosmetic Injection Botulinum toxin injection is a procedure to soften facial lines and wrinkles and treat migraibes. Botulinum toxin is produced by a bacterium called Clostridium botulinum. The toxin is injected into muscles with a very thin needle. The toxin works by blocking nerve impulses to specific muscles. This weakens and temporarily paralyzes those muscles, which reduces the lines of facial expression and less nerve irritation and less migraines. Tell a health care provider about:  Any allergies you have, especially allergies to eggs.  All medicines you are taking, including antibiotics, vitamins, herbs, eye drops, creams, and over-the-counter medicines.  Any problems you or family members have had with anesthetic medicines.  Any blood disorders you have.  Any surgeries you have had.  Any medical conditions you have, including neurologicaldisorders, hyperthyroidism, lung disease, or heart disease.  Whether you are pregnant, may be pregnant, or are breastfeeding. What are the risks? Generally, this is a safe procedure. However, problems may occur. Common  problems include pain, swelling, and bruising at the injection sites. Other problems include:  Pain in the neck or  jaw.  Infection.  Allergic reaction to the injection.  Damage to nearby structures or organs.  Drooping of the eyebrow or eyelid.  Double or blurred vision.  Changes in voice or speech.  Losing the ability to close one or both eyes.  Trouble swallowing. What happens before the procedure?  Do not drink any alcohol as told by your health care provider.  Ask your health care provider about:  Changing or stopping your regular medicines. This is especially important if you are taking diabetes medicines or blood thinners.  Taking medicines such as aspirin and ibuprofen. These medicines can thin your blood. Do not take these medicines before your procedure if your health care provider instructs you not to.  Plan to have someone take you home after the procedure, if told by your health care provider. What happens during the procedure?  You may be given a medicine to numb the area (local anesthetic).  Your health care provider will inject small amounts of the toxin into specific muscles. The number of injections that you get will depend on your specific condition and the area that is being treated. What happens after the procedure?  Follow up with your health care provider as directed. This information is not intended to replace advice given to you by your health care provider. Make sure you discuss any questions you have with your health care provider. Document Released: 03/13/2004 Document Revised: 11/22/2015 Document Reviewed: 12/20/2014 Elsevier Interactive Patient Education  2017 [employer].

## 2016-10-07 ENCOUNTER — Ambulatory Visit (INDEPENDENT_AMBULATORY_CARE_PROVIDER_SITE_OTHER): Payer: BLUE CROSS/BLUE SHIELD | Admitting: Family Medicine

## 2016-10-07 ENCOUNTER — Encounter: Payer: Self-pay | Admitting: Family Medicine

## 2016-10-07 DIAGNOSIS — N83202 Unspecified ovarian cyst, left side: Secondary | ICD-10-CM

## 2016-10-07 NOTE — Assessment & Plan Note (Signed)
Patient continues to desire removal of her ovary on this side. Given it's normal appearance it is difficult for me to offer this as an option. She is concerned that she frequently gets cysts and has to miss work due to this. Other options that are less invasive are declined by the patient. She will return with symptoms and possibly be worked in and evaluated to determine if surgery is indicated.

## 2016-10-07 NOTE — Patient Instructions (Signed)
Pelvic Pain, Female °Pelvic pain is pain in your lower belly (abdomen), below your belly button and between your hips. The pain may start suddenly (acute), keep coming back (recurring), or last a long time (chronic). Pelvic pain that lasts longer than six months is considered chronic. There are many causes of pelvic pain. Sometimes the cause of your pelvic pain is not known. °Follow these instructions at home: °· Take over-the-counter and prescription medicines only as told by your doctor. °· Rest as told by your doctor. °· Do not have sex it if hurts. °· Keep a journal of your pelvic pain. Write down: °¨ When the pain started. °¨ Where the pain is located. °¨ What seems to make the pain better or worse, such as food or your menstrual cycle. °¨ Any symptoms you have along with the pain. °· Keep all follow-up visits as told by your doctor. This is important. °Contact a doctor if: °· Medicine does not help your pain. °· Your pain comes back. °· You have new symptoms. °· You have unusual vaginal discharge or bleeding. °· You have a fever or chills. °· You are having a hard time pooping (constipation). °· You have blood in your pee (urine) or poop (stool). °· Your pee smells bad. °· You feel weak or lightheaded. °Get help right away if: °· You have sudden pain that is very bad. °· Your pain continues to get worse. °· You have very bad pain and also have any of the following symptoms: °¨ A fever. °¨ Feeling stick to your stomach (nausea). °¨ Throwing up (vomiting). °¨ Being very sweaty. °· You pass out (lose consciousness). °This information is not intended to replace advice given to you by your health care provider. Make sure you discuss any questions you have with your health care provider. °Document Released: 12/03/2007 Document Revised: 07/11/2015 Document Reviewed: 04/06/2015 °Elsevier Interactive Patient Education © 2017 Elsevier Inc. ° °

## 2016-10-07 NOTE — Progress Notes (Signed)
   Subjective:    Patient ID: Heather Gardner is a 36 y.o. female presenting with Follow-up  on 10/07/2016  HPI: Back for f/u. Has had pain on he left and noted to have a pelvic sonogram that is normal. She is concerned that she continues to have issues with her ovaries causing pain. She is concerned about the repetitive nature. She has tried OC's in the past and that has not helped and also has worsened her depression and anxiety.  Review of Systems  Constitutional: Negative for chills and fever.  Respiratory: Negative for shortness of breath.   Cardiovascular: Negative for chest pain.  Gastrointestinal: Negative for abdominal pain, nausea and vomiting.  Genitourinary: Negative for dysuria.  Skin: Negative for rash.      Objective:    BP 107/74 (BP Location: Left Arm, Patient Position: Sitting, Cuff Size: Large)   Pulse 73   Resp 20   Ht 5\' 10"  (1.778 m)   Wt 232 lb (105.2 kg)   BMI 33.29 kg/m  Physical Exam  Constitutional: She is oriented to person, place, and time. She appears well-developed and well-nourished. No distress.  HENT:  Head: Normocephalic and atraumatic.  Eyes: No scleral icterus.  Neck: Neck supple.  Cardiovascular: Normal rate.   Pulmonary/Chest: Effort normal.  Abdominal: Soft.  Neurological: She is alert and oriented to person, place, and time.  Skin: Skin is warm and dry.  Psychiatric: She has a normal mood and affect.        Assessment & Plan:   Problem List Items Addressed This Visit      Unprioritized   Ovarian cyst, left    Patient continues to desire removal of her ovary on this side. Given it's normal appearance it is difficult for me to offer this as an option. She is concerned that she frequently gets cysts and has to miss work due to this. Other options that are less invasive are declined by the patient. She will return with symptoms and possibly be worked in and evaluated to determine if surgery is indicated.         Total  face-to-face time with patient: 15 minutes. Over 50% of encounter was spent on counseling and coordination of care. Return in about 3 months (around 01/06/2017), or if symptoms worsen or fail to improve, for IUD replacement.  Donnamae Jude 10/07/2016 10:37 AM

## 2016-10-13 ENCOUNTER — Ambulatory Visit: Payer: BLUE CROSS/BLUE SHIELD | Admitting: Diagnostic Neuroimaging

## 2016-10-14 ENCOUNTER — Telehealth: Payer: Self-pay | Admitting: Neurology

## 2016-10-14 NOTE — Telephone Encounter (Signed)
I have called this patient several times regarding consent for her medication. Today I called her twice to let her know we would have to cancel her apt for tomorrow due to weather and we also do not have consent to schedule delivery on medication. She did not answer so I left a VM asking her to call me back.

## 2016-10-15 ENCOUNTER — Encounter: Payer: Self-pay | Admitting: Neurology

## 2016-10-15 ENCOUNTER — Ambulatory Visit (INDEPENDENT_AMBULATORY_CARE_PROVIDER_SITE_OTHER): Payer: BLUE CROSS/BLUE SHIELD | Admitting: Neurology

## 2016-10-15 VITALS — BP 114/80 | HR 71 | Wt 229.2 lb

## 2016-10-15 DIAGNOSIS — G43709 Chronic migraine without aura, not intractable, without status migrainosus: Secondary | ICD-10-CM | POA: Diagnosis not present

## 2016-10-15 NOTE — Progress Notes (Signed)
Consent Form Botulism Toxin Injection For Chronic Migraine  Botulism toxin has been approved by the Federal drug administration for treatment of chronic migraine. Botulism toxin does not cure chronic migraine and it may not be effective in some patients.  The administration of botulism toxin is accomplished by injecting a small amount of toxin into the muscles of the neck and head. Dosage must be titrated for each individual. Any benefits resulting from botulism toxin tend to wear off after 3 months with a repeat injection required if benefit is to be maintained. Injections are usually done every 3-4 months with maximum effect peak achieved by about 2 or 3 weeks. Botulism toxin is expensive and you should be sure of what costs you will incur resulting from the injection.  The side effects of botulism toxin use for chronic migraine may include:   -Transient, and usually mild, facial weakness with facial injections  -Transient, and usually mild, head or neck weakness with head/neck injections  -Reduction or loss of forehead facial animation due to forehead muscle              weakness  -Eyelid drooping  -Dry eye  -Pain at the site of injection or bruising at the site of injection  -Double vision  -Potential unknown long term risks  Contraindications: You should not have Botox if you are pregnant, nursing, allergic to albumin, have an infection, skin condition, or muscle weakness at the site of the injection, or have myasthenia gravis, Lambert-Eaton syndrome, or ALS.  It is also possible that as with any injection, there may be an allergic reaction or no effect from the medication. Reduced effectiveness after repeated injections is sometimes seen and rarely infection at the injection site may occur. All care will be taken to prevent these side effects. If therapy is given over a long time, atrophy and wasting in the muscle injected may occur. Occasionally the patient's become refractory to  treatment because they develop antibodies to the toxin. In this event, therapy needs to be modified.  I have read the above information and consent to the administration of botulism toxin.    ______________  _____   _________________  Patient signature     Date   Witness signature       BOTOX PROCEDURE NOTE FOR MIGRAINE HEADACHE    Contraindications and precautions discussed with patient(above). Aseptic procedure was observed and patient tolerated procedure. Procedure performed by Dr. Georgia Dom  The condition has existed for more than 6 months, and pt does not have a diagnosis of ALS, Myasthenia Gravis or Lambert-Eaton Syndrome. Risks and benefits of injections discussed and pt agrees to proceed with the procedure. Written consent obtained  These injections are medically necessary. He receives good benefits from these injections. These injections do not cause sedations or hallucinations which the oral therapies may cause.  Indication/Diagnosis: chronic migraine BOTOX(J0585) injection was performed according to protocol by [employer]. 200 units of BOTOX was dissolved into 4 cc NS.  NDC: [phone]  Type of toxin: Botox Botox 100 units/vial x 2 vials from [place] [phone] Lot [id number] Exp 11 2020  Diluted in 4 ml of Bacteriostatic 0.9% NaCl NDC 0932-6712-45 Lot [id number] Exp [id number]  Topical pain reliever applied to injection areas w/ gauze Lidocaine 5% cream NDC 25053-976-73 Lot [id number] Exp 06/19         Description of procedure:  The patient was placed in a sitting position. The standard protocol was used for Botox as follows, with  5 units of Botox injected at each site:   -Procerus muscle, midline injection  -Corrugator muscle, bilateral injection  -Frontalis muscle, bilateral injection, with 2 sites each side, medial injection was performed in the upper one third of the frontalis muscle, in the region vertical from the  medial inferior edge of the superior orbital rim. The lateral injection was again in the upper one third of the forehead vertically above the lateral limbus of the cornea, 1.5 cm lateral to the medial injection site.  -Temporalis muscle injection, 4 sites, bilaterally. The first injection was 3 cm above the tragus of the ear, second injection site was 1.5 cm to 3 cm up from the first injection site in line with the tragus of the ear. The third injection site was 1.5-3 cm forward between the first 2 injection sites. The fourth injection site was 1.5 cm posterior to the second injection site.  -Occipitalis muscle injection, 3 sites, bilaterally. The first injection was done one half way between the occipital protuberance and the tip of the mastoid process behind the ear. The second injection site was done lateral and superior to the first, 1 fingerbreadth from the first injection. The third injection site was 1 fingerbreadth superiorly and medially from the first injection site.  -Cervical paraspinal muscle injection, 2 sites, bilateral knee first injection site was 1 cm from the midline of the cervical spine, 3 cm inferior to the lower border of the occipital protuberance. The second injection site was 1.5 cm superiorly and laterally to the first injection site.  -Trapezius muscle injection was performed at 3 sites, bilaterally. The first injection site was in the upper trapezius muscle halfway between the inflection point of the neck, and the acromion. The second injection site was one half way between the acromion and the first injection site. The third injection was done between the first injection site and the inflection point of the neck.   Will return for repeat injection in 3 months.   A 200 unit sof Botox was used, 155 units were injected, the rest of the Botox was wasted. The patient tolerated the procedure well, there were no complications of the above procedure.

## 2016-10-15 NOTE — Progress Notes (Signed)
Botox 100 units/vial x 2 vials from [place] [phone] Lot [id number] Exp 11 2020  Diluted in 4 ml of Bacteriostatic 0.9% NaCl NDC 8875-7972-82 Lot [id number] Exp [id number]  Topical pain reliever applied to injection areas w/ gauze Lidocaine 5% cream NDC 37943-276-14 Lot [id number] Exp 06/19

## 2016-10-27 ENCOUNTER — Encounter: Payer: Self-pay | Admitting: Neurology

## 2016-10-30 ENCOUNTER — Telehealth: Payer: Self-pay | Admitting: Neurology

## 2016-10-30 NOTE — Telephone Encounter (Signed)
I called the patient to schedule injection, she did not answer so I left a VM asking her to call me back.  °

## 2016-11-13 DIAGNOSIS — Z0271 Encounter for disability determination: Secondary | ICD-10-CM

## 2016-12-09 ENCOUNTER — Telehealth: Payer: Self-pay | Admitting: *Deleted

## 2016-12-09 NOTE — Telephone Encounter (Signed)
Received received request from [employer] disability claim for records from 02/28/17. Patient was not seen on this office on that date. Records from office visit with Dr Leta Baptist on 02/18/16 and office visit with Dr Rexene Alberts on 03/13/16 printed. Sent to MR for processing.

## 2017-01-05 DIAGNOSIS — F333 Major depressive disorder, recurrent, severe with psychotic symptoms: Secondary | ICD-10-CM | POA: Diagnosis not present

## 2017-01-07 DIAGNOSIS — F333 Major depressive disorder, recurrent, severe with psychotic symptoms: Secondary | ICD-10-CM | POA: Diagnosis not present

## 2017-01-08 ENCOUNTER — Telehealth: Payer: Self-pay | Admitting: Neurology

## 2017-01-08 NOTE — Telephone Encounter (Signed)
Called patient and left her a message relaying she needs to call and give consent for Botox shipment [facility] [phone].

## 2017-01-12 DIAGNOSIS — F333 Major depressive disorder, recurrent, severe with psychotic symptoms: Secondary | ICD-10-CM | POA: Diagnosis not present

## 2017-01-12 DIAGNOSIS — G43709 Chronic migraine without aura, not intractable, without status migrainosus: Secondary | ICD-10-CM | POA: Diagnosis not present

## 2017-01-15 ENCOUNTER — Ambulatory Visit (INDEPENDENT_AMBULATORY_CARE_PROVIDER_SITE_OTHER): Payer: BLUE CROSS/BLUE SHIELD | Admitting: Neurology

## 2017-01-15 VITALS — BP 119/77 | HR 93 | Ht 70.0 in | Wt 238.8 lb

## 2017-01-15 DIAGNOSIS — G43709 Chronic migraine without aura, not intractable, without status migrainosus: Secondary | ICD-10-CM

## 2017-01-15 NOTE — Progress Notes (Signed)
Botox 100 units/vial x 2 from [place] [phone] Lot [id number] Exp 10 2020  Diluted in 4 ml of Bacteriostatic 0.9% NaCl NDC 8682-5749-35 Lot [id number] Exp [id number]

## 2017-01-18 NOTE — Progress Notes (Signed)

## 2017-01-19 DIAGNOSIS — F333 Major depressive disorder, recurrent, severe with psychotic symptoms: Secondary | ICD-10-CM | POA: Diagnosis not present

## 2017-01-21 DIAGNOSIS — F333 Major depressive disorder, recurrent, severe with psychotic symptoms: Secondary | ICD-10-CM | POA: Diagnosis not present

## 2017-01-23 ENCOUNTER — Encounter: Payer: Self-pay | Admitting: Family Medicine

## 2017-01-23 ENCOUNTER — Ambulatory Visit (INDEPENDENT_AMBULATORY_CARE_PROVIDER_SITE_OTHER): Payer: BLUE CROSS/BLUE SHIELD | Admitting: Family Medicine

## 2017-01-23 VITALS — BP 108/71 | HR 86 | Ht 70.0 in | Wt 237.0 lb

## 2017-01-23 DIAGNOSIS — Z01812 Encounter for preprocedural laboratory examination: Secondary | ICD-10-CM

## 2017-01-23 DIAGNOSIS — Z30432 Encounter for removal of intrauterine contraceptive device: Secondary | ICD-10-CM | POA: Diagnosis not present

## 2017-01-23 DIAGNOSIS — Z30430 Encounter for insertion of intrauterine contraceptive device: Secondary | ICD-10-CM

## 2017-01-23 DIAGNOSIS — Z3042 Encounter for surveillance of injectable contraceptive: Secondary | ICD-10-CM | POA: Diagnosis not present

## 2017-01-23 LAB — POCT URINE PREGNANCY: PREG TEST UR: NEGATIVE

## 2017-01-23 MED ORDER — LEVONORGESTREL 18.6 MCG/DAY IU IUD
INTRAUTERINE_SYSTEM | Freq: Once | INTRAUTERINE | Status: AC
  Administered 2017-01-23: 12:00:00 via INTRAUTERINE

## 2017-01-23 NOTE — Progress Notes (Signed)
Last pap 07/2015 - Normal Pt desires IUD remove/replace - IUD in place for 7 years.

## 2017-01-23 NOTE — Addendum Note (Signed)
Addended by: Gretchen Short on: 01/23/2017 12:27 PM   Modules accepted: Orders

## 2017-01-23 NOTE — Progress Notes (Signed)
   Subjective: Patient here for IUD change.  Has been in for 7 years and due to be changed. She has no cycles and desires to continue Mirena IUD for contraception.  Objective: Vitals:   01/23/17 1057  BP: 108/71  Pulse: 86    Patient identified, informed consent performed, signed copy in chart, time out was performed Procedure: Speculum placed inside vagina.  Cervix visualized.  Strings grasped with ring forceps.  IUD removed intact.  Procedure: Cervix visualized.  Cleaned with Betadine x 2.  Grasped anteriourly with a single tooth tenaculum.  Uterus sounded to 9 cm cm.  Liletta IUD placed per manufacturer's recommendations.  Strings trimmed to 3 cm.   Patient given post procedure instructions and Liletta care card with expiration date.  Patient is asked to check IUD strings periodically and follow up in 4-6 weeks for IUD check.  Impression: Pre-procedure lab exam - Plan: POCT urine pregnancy IUD insertion  Plan: Continue IUD for 5-7 years.

## 2017-01-23 NOTE — Patient Instructions (Signed)

## 2017-01-26 DIAGNOSIS — F333 Major depressive disorder, recurrent, severe with psychotic symptoms: Secondary | ICD-10-CM | POA: Diagnosis not present

## 2017-01-30 ENCOUNTER — Ambulatory Visit (INDEPENDENT_AMBULATORY_CARE_PROVIDER_SITE_OTHER): Payer: BLUE CROSS/BLUE SHIELD | Admitting: Family Medicine

## 2017-01-30 ENCOUNTER — Encounter: Payer: Self-pay | Admitting: Family Medicine

## 2017-01-30 VITALS — BP 113/79 | HR 81 | Ht 70.5 in | Wt 240.0 lb

## 2017-01-30 DIAGNOSIS — N719 Inflammatory disease of uterus, unspecified: Secondary | ICD-10-CM

## 2017-01-30 MED ORDER — DOXYCYCLINE HYCLATE 100 MG PO CAPS: 100.0000 mg | ORAL_CAPSULE | Freq: Two times a day (BID) | ORAL | 0 refills | Status: DC

## 2017-01-30 NOTE — Patient Instructions (Signed)

## 2017-01-30 NOTE — Progress Notes (Signed)
   Subjective:    Patient ID: Heather Gardner is a 36 y.o. female presenting with Pelvic Pain  on 01/30/2017  HPI: Reports left sided pain starting 3 days ago. Worse on Wednesday and could not walk. Improving since then and now only with pain if presses on that side. Pain was constant and severe, not uterine cramping. Denies fever, chills, nausea or vomiting.   Review of Systems  Constitutional: Negative for chills and fever.  Respiratory: Negative for shortness of breath.   Cardiovascular: Negative for chest pain.  Gastrointestinal: Negative for abdominal pain, nausea and vomiting.  Genitourinary: Negative for dysuria.  Skin: Negative for rash.      Objective:    BP 113/79   Pulse 81   Ht 5' 10.5" (1.791 m)   Wt 240 lb (108.9 kg)   BMI 33.95 kg/m  Physical Exam  Constitutional: She is oriented to person, place, and time. She appears well-developed and well-nourished. No distress.  HENT:  Head: Normocephalic and atraumatic.  Eyes: No scleral icterus.  Neck: Neck supple.  Cardiovascular: Normal rate.   Pulmonary/Chest: Effort normal.  Abdominal: Soft.  Genitourinary: Uterus is not enlarged and not tender. Cervix exhibits no motion tenderness. Right adnexum displays no mass and no tenderness. Left adnexum displays tenderness (mild). Left adnexum displays no mass. Vaginal discharge (yellow) found.  Neurological: She is alert and oriented to person, place, and time.  Skin: Skin is warm and dry.  Psychiatric: She has a normal mood and affect.   Ultrasound report: TVUS reveals IUD situated in the endometrial canal, Uterus has a normal appearance and is 8.55 x 5.12 x 4.43cm Right ovary is normal in appearance and 4.36 x 2.12 x 2.2cm Left ovary is normal in appearance and 3.39 x 2.70 x 4.21cm There is no free pelvic fluid    Assessment & Plan:  Endometritis - likely given proximity of IUD insertion and normal u/s appearance and vaginal d/c. If not improved in 2 wks, return for  f/u. - Plan: doxycycline (VIBRAMYCIN) 100 MG capsule  Total face-to-face time with patient: 25 minutes. Over 50% of encounter was spent on counseling and coordination of care. Return in about 3 months (around 05/02/2017).  Donnamae Jude 01/30/2017 8:30 AM

## 2017-02-02 DIAGNOSIS — F333 Major depressive disorder, recurrent, severe with psychotic symptoms: Secondary | ICD-10-CM | POA: Diagnosis not present

## 2017-02-04 DIAGNOSIS — F333 Major depressive disorder, recurrent, severe with psychotic symptoms: Secondary | ICD-10-CM | POA: Diagnosis not present

## 2017-02-10 DIAGNOSIS — Z111 Encounter for screening for respiratory tuberculosis: Secondary | ICD-10-CM | POA: Diagnosis not present

## 2017-02-11 DIAGNOSIS — F333 Major depressive disorder, recurrent, severe with psychotic symptoms: Secondary | ICD-10-CM | POA: Diagnosis not present

## 2017-02-25 DIAGNOSIS — F3174 Bipolar disorder, in full remission, most recent episode manic: Secondary | ICD-10-CM | POA: Diagnosis not present

## 2017-02-25 DIAGNOSIS — F333 Major depressive disorder, recurrent, severe with psychotic symptoms: Secondary | ICD-10-CM | POA: Diagnosis not present

## 2017-02-25 DIAGNOSIS — F900 Attention-deficit hyperactivity disorder, predominantly inattentive type: Secondary | ICD-10-CM | POA: Diagnosis not present

## 2017-02-25 DIAGNOSIS — F3131 Bipolar disorder, current episode depressed, mild: Secondary | ICD-10-CM | POA: Diagnosis not present

## 2017-03-04 DIAGNOSIS — F333 Major depressive disorder, recurrent, severe with psychotic symptoms: Secondary | ICD-10-CM | POA: Diagnosis not present

## 2017-03-11 DIAGNOSIS — F333 Major depressive disorder, recurrent, severe with psychotic symptoms: Secondary | ICD-10-CM | POA: Diagnosis not present

## 2017-03-18 DIAGNOSIS — F333 Major depressive disorder, recurrent, severe with psychotic symptoms: Secondary | ICD-10-CM | POA: Diagnosis not present

## 2017-03-23 DIAGNOSIS — F333 Major depressive disorder, recurrent, severe with psychotic symptoms: Secondary | ICD-10-CM | POA: Diagnosis not present

## 2017-04-01 DIAGNOSIS — F333 Major depressive disorder, recurrent, severe with psychotic symptoms: Secondary | ICD-10-CM | POA: Diagnosis not present

## 2017-04-02 ENCOUNTER — Telehealth: Payer: Self-pay

## 2017-04-02 NOTE — Telephone Encounter (Signed)
I called pt to discuss her aimovig. No answer, left a message asking her to call me back.

## 2017-04-06 MED ORDER — ERENUMAB-AOOE 70 MG/ML ~~LOC~~ SOAJ: 70.0000 mg | SUBCUTANEOUS | 0 refills | Status: DC

## 2017-04-06 NOTE — Telephone Encounter (Signed)
Pt returned my call. She completed her first aimovig injection in 04/04/17. I advised her that we have been instructed to send all aimovig RXs now to the local pharmacy. Pt is agreeable to this, and [facility] [place]. Pt is not due for another injection until 05/05/17. Pt verbalized understanding.

## 2017-04-06 NOTE — Telephone Encounter (Signed)
RX for aimovig faxed [facility] per pt request. Received a receipt of confirmation.'

## 2017-04-08 ENCOUNTER — Telehealth: Payer: Self-pay

## 2017-04-08 DIAGNOSIS — F333 Major depressive disorder, recurrent, severe with psychotic symptoms: Secondary | ICD-10-CM | POA: Diagnosis not present

## 2017-04-08 NOTE — Telephone Encounter (Signed)
PA for aimovig completed and sent to [facility]. Should have a determination in 3 business days.

## 2017-04-09 NOTE — Telephone Encounter (Signed)
Patient has been denied for Aimovig, [employer] just called. No need to return call, letter has been faxed.

## 2017-04-12 NOTE — Telephone Encounter (Signed)
Erasmo Downer, all patients who are declined Aimovig and have also been declined via PA should continue to get the [place]. It appears that this is the case, we filed a PA and she was declined. I;m not sure what we have decided to do with these patients as a PA is needed every 3-4 months for them to continue to get their Aimovig in the setting of denial. Forwarding to [facility] to discuss but in the meantime she should get her Aimovig. Please discuss with Sharyn Lull, she has the [place] representative's name and phone number to call and discuss. thanks

## 2017-04-13 DIAGNOSIS — F3111 Bipolar disorder, current episode manic without psychotic features, mild: Secondary | ICD-10-CM | POA: Diagnosis not present

## 2017-04-13 DIAGNOSIS — F3131 Bipolar disorder, current episode depressed, mild: Secondary | ICD-10-CM | POA: Diagnosis not present

## 2017-04-13 DIAGNOSIS — F900 Attention-deficit hyperactivity disorder, predominantly inattentive type: Secondary | ICD-10-CM | POA: Diagnosis not present

## 2017-04-15 NOTE — Telephone Encounter (Signed)
I called pt to discuss. No answer, left a message asking her to call me back. 

## 2017-04-18 ENCOUNTER — Other Ambulatory Visit: Payer: Self-pay | Admitting: Neurology

## 2017-04-20 NOTE — Telephone Encounter (Signed)
I called pt again to discuss. No answer, left a message asking him to call me back. 

## 2017-04-20 NOTE — Telephone Encounter (Signed)
It is fine for her to do both botox and Aimovig at some point in the future we may have to chose between the two due to insurance.   Danielle, please call for botox thanks!

## 2017-04-20 NOTE — Telephone Encounter (Signed)
Pt returned my call. I advised her that her aimovig pa was denied by her insurance. I advised pt that she needs to sign up for the aimovig access card and gave her this website. The aimovig rep informed us that if the pa was denied, pt can get 6 months of free aimovig if they are signed up with the access card. I made an appt with the pt for 08/09/17 to discuss the efficacy of aimovig with Dr. Jaynee Eagles to determine if pt should continue it, and that is when an appeal can be completed. Pt is agreeable to this.  Pt is asking to be scheduled for botox injections, her last injections was in July. Will send to [facility].  Also, pt is wondering if it is ok to be doing both the botox injections and the aimovig?

## 2017-04-21 NOTE — Telephone Encounter (Signed)
I called patient to schedule her next injection, she did not answer so I left a VM asking her to call back.

## 2017-04-21 NOTE — Telephone Encounter (Signed)
I called pt again to discuss, no answer, left a message asking her to call me back. 

## 2017-04-22 DIAGNOSIS — F333 Major depressive disorder, recurrent, severe with psychotic symptoms: Secondary | ICD-10-CM | POA: Diagnosis not present

## 2017-04-22 NOTE — Telephone Encounter (Signed)
I called pt again to discuss. No answer, left a message asking her to call me back. 

## 2017-04-23 ENCOUNTER — Telehealth: Payer: Self-pay | Admitting: Neurology

## 2017-04-23 NOTE — Telephone Encounter (Signed)
Patient r/c and says that her headache days have reduced by 50% since starting botox.

## 2017-04-23 NOTE — Telephone Encounter (Signed)
Patient called back and I made her aware of Dr. Cathren Laine recommendations. She voiced understanding.

## 2017-04-23 NOTE — Telephone Encounter (Signed)
I tried calling the patient twice today in regards to her authorization for botox. I need to know what her reduction days have been. There was no option for VM.

## 2017-04-24 NOTE — Telephone Encounter (Signed)
I spoke to Heather Gardner with [place] and she was wanting to know if it has been reduced by 50%. I informed her that it had been. She states she will fax the authorization for the Botox.

## 2017-04-27 NOTE — Telephone Encounter (Signed)
Noted, thank you

## 2017-04-28 ENCOUNTER — Ambulatory Visit: Payer: BLUE CROSS/BLUE SHIELD | Admitting: Neurology

## 2017-04-29 DIAGNOSIS — F333 Major depressive disorder, recurrent, severe with psychotic symptoms: Secondary | ICD-10-CM | POA: Diagnosis not present

## 2017-05-13 DIAGNOSIS — F333 Major depressive disorder, recurrent, severe with psychotic symptoms: Secondary | ICD-10-CM | POA: Diagnosis not present

## 2017-05-18 ENCOUNTER — Encounter: Payer: Self-pay | Admitting: Neurology

## 2017-05-18 ENCOUNTER — Ambulatory Visit (INDEPENDENT_AMBULATORY_CARE_PROVIDER_SITE_OTHER): Payer: BLUE CROSS/BLUE SHIELD | Admitting: Neurology

## 2017-05-18 VITALS — BP 123/73 | HR 81

## 2017-05-18 DIAGNOSIS — G43709 Chronic migraine without aura, not intractable, without status migrainosus: Secondary | ICD-10-CM | POA: Diagnosis not present

## 2017-05-18 NOTE — Progress Notes (Signed)
Botox 100 units x 2 vials LOT: [id number] EXP: 11/2019 NDC: 9381-8299-37 B&B  Bacteriostatic 0.9% Sodium Chloride 4 mL LOT: [id number] EXP: 10/29/2018 NDC: 9381-0175-10 //BCrn

## 2017-05-20 ENCOUNTER — Other Ambulatory Visit: Payer: Self-pay

## 2017-05-20 DIAGNOSIS — F333 Major depressive disorder, recurrent, severe with psychotic symptoms: Secondary | ICD-10-CM | POA: Diagnosis not present

## 2017-05-20 MED ORDER — PROPRANOLOL HCL 20 MG PO TABS: 20.0000 mg | ORAL_TABLET | Freq: Two times a day (BID) | ORAL | 1 refills | Status: DC

## 2017-05-20 NOTE — Telephone Encounter (Signed)
Refill sent for 90 days.  

## 2017-05-27 DIAGNOSIS — S058X2A Other injuries of left eye and orbit, initial encounter: Secondary | ICD-10-CM | POA: Diagnosis not present

## 2017-05-27 DIAGNOSIS — F333 Major depressive disorder, recurrent, severe with psychotic symptoms: Secondary | ICD-10-CM | POA: Diagnosis not present

## 2017-06-03 DIAGNOSIS — F333 Major depressive disorder, recurrent, severe with psychotic symptoms: Secondary | ICD-10-CM | POA: Diagnosis not present

## 2017-06-10 DIAGNOSIS — F333 Major depressive disorder, recurrent, severe with psychotic symptoms: Secondary | ICD-10-CM | POA: Diagnosis not present

## 2017-06-17 DIAGNOSIS — F333 Major depressive disorder, recurrent, severe with psychotic symptoms: Secondary | ICD-10-CM | POA: Diagnosis not present

## 2017-06-25 DIAGNOSIS — F333 Major depressive disorder, recurrent, severe with psychotic symptoms: Secondary | ICD-10-CM | POA: Diagnosis not present

## 2017-07-14 NOTE — Telephone Encounter (Signed)
Called patient and LVM (ok per DPR) asking for call back. I informed her that I am working on another PA for Aimovig and I have a few medications regarding her medications.

## 2017-07-14 NOTE — Telephone Encounter (Signed)
Patient returned my call. I confirmed the following medications she takes for migraine treatment: Propranolol, Indomethacin, Ondansetron, Topiramate ER, Gabapentin. The updated list will need to be included in the patient's PA for Aimovig.    PA completed through cover my meds (Key: [id number]).  "Your information has been submitted to [employer]. [employer] is reviewing the PA request and you will receive an electronic response. You may check for the updated outcome later by reopening this request. The standard fax determination will also be sent to you directly. If you have any questions about your PA submission, contact [employer] at [phone]."

## 2017-07-16 NOTE — Telephone Encounter (Addendum)
Received a fax stating the patient's pharmacy coverage had expired and to call the number on the back of the card.   I called [employer] and spoke with Baker Janus. She reported that pt has a new policy number now. Also her [facility] [phone].

## 2017-07-16 NOTE — Telephone Encounter (Addendum)
Called patient. She states that her new policy is the following:   [place] ID: [id number] GRP: [id number] Policy Holder: [name]  She also stated that she had just received a phone call from [facility] stating that they would send a shipment of aimovig to her house within the next week. She still has a dose in her refrigerator.

## 2017-08-10 ENCOUNTER — Encounter: Payer: Self-pay | Admitting: Neurology

## 2017-08-10 ENCOUNTER — Ambulatory Visit: Payer: BC Managed Care – PPO | Admitting: Neurology

## 2017-08-10 VITALS — BP 110/69 | HR 82 | Ht 70.0 in | Wt 245.0 lb

## 2017-08-10 DIAGNOSIS — G43711 Chronic migraine without aura, intractable, with status migrainosus: Secondary | ICD-10-CM | POA: Diagnosis not present

## 2017-08-10 MED ORDER — ERENUMAB-AOOE 70 MG/ML ~~LOC~~ SOAJ: 140.0000 mg | SUBCUTANEOUS | 11 refills | Status: DC

## 2017-08-10 MED ORDER — SUMATRIPTAN SUCCINATE 6 MG/0.5ML ~~LOC~~ SOLN: SUBCUTANEOUS | 11 refills | Status: DC

## 2017-08-10 MED ORDER — METHYLPREDNISOLONE 4 MG PO TBPK: ORAL_TABLET | ORAL | 1 refills | Status: DC

## 2017-08-10 NOTE — Patient Instructions (Signed)
Methylprednisolone tablets What is this medicine? METHYLPREDNISOLONE (meth ill pred NISS oh lone) is a corticosteroid. It is commonly used to treat inflammation of the skin, joints, lungs, and other organs. Common conditions treated include asthma, allergies, and arthritis. It is also used for other conditions, such as blood disorders and diseases of the adrenal glands. This medicine may be used for other purposes; ask your health care provider or pharmacist if you have questions. COMMON BRAND NAME(S): Medrol, Medrol Dosepak What should I tell my health care provider before I take this medicine? They need to know if you have any of these conditions: -Cushing's syndrome -eye disease, vision problems -diabetes -glaucoma -heart disease -high blood pressure -infection (especially a virus infection such as chickenpox, cold sores, or herpes) -liver disease -mental illness -myasthenia gravis -osteoporosis -recently received or scheduled to receive a vaccine -seizures -stomach or intestine problems -thyroid disease -an unusual or allergic reaction to lactose, methylprednisolone, other medicines, foods, dyes, or preservatives -pregnant or trying to get pregnant -breast-feeding How should I use this medicine? Take this medicine by mouth with a glass of water. Follow the directions on the prescription label. Take this medicine with food. If you are taking this medicine once a day, take it in the morning. Do not take it more often than directed. Do not suddenly stop taking your medicine because you may develop a severe reaction. Your doctor will tell you how much medicine to take. If your doctor wants you to stop the medicine, the dose may be slowly lowered over time to avoid any side effects. Talk to your pediatrician regarding the use of this medicine in children. Special care may be needed. Overdosage: If you think you have taken too much of this medicine contact a poison control center or  emergency room at once. NOTE: This medicine is only for you. Do not share this medicine with others. What if I miss a dose? If you miss a dose, take it as soon as you can. If it is almost time for your next dose, talk to your doctor or health care professional. You may need to miss a dose or take an extra dose. Do not take double or extra doses without advice. What may interact with this medicine? Do not take this medicine with any of the following medications: -alefacept -echinacea -live virus vaccines -metyrapone -mifepristone This medicine may also interact with the following medications: -amphotericin B -aspirin and aspirin-like medicines -certain antibiotics like erythromycin, clarithromycin, troleandomycin -certain medicines for diabetes -certain medicines for fungal infections like ketoconazole -certain medicines for seizures like carbamazepine, phenobarbital, phenytoin -certain medicines that treat or prevent blood clots like warfarin -cholestyramine -cyclosporine -digoxin -diuretics -female hormones, like estrogens and birth control pills -isoniazid -NSAIDs, medicines for pain inflammation, like ibuprofen or naproxen -other medicines for myasthenia gravis -rifampin -vaccines This list may not describe all possible interactions. Give your health care provider a list of all the medicines, herbs, non-prescription drugs, or dietary supplements you use. Also tell them if you smoke, drink alcohol, or use illegal drugs. Some items may interact with your medicine. What should I watch for while using this medicine? Tell your doctor or healthcare professional if your symptoms do not start to get better or if they get worse. Do not stop taking except on your doctor's advice. You may develop a severe reaction. Your doctor will tell you how much medicine to take. This medicine may increase your risk of getting an infection. Tell your doctor or health care professional if  you are around  anyone with measles or chickenpox, or if you develop sores or blisters that do not heal properly. This medicine may affect blood sugar levels. If you have diabetes, check with your doctor or health care professional before you change your diet or the dose of your diabetic medicine. Tell your doctor or health care professional right away if you have any change in your eyesight. Using this medicine for a long time may increase your risk of low bone mass. Talk to your doctor about bone health. What side effects may I notice from receiving this medicine? Side effects that you should report to your doctor or health care professional as soon as possible: -allergic reactions like skin rash, itching or hives, swelling of the face, lips, or tongue -bloody or tarry stools -changes in vision -hallucination, loss of contact with reality -muscle cramps -muscle pain -palpitations -signs and symptoms of high blood sugar such as dizziness; dry mouth; dry skin; fruity breath; nausea; stomach pain; increased hunger or thirst; increased urination -signs and symptoms of infection like fever or chills; cough; sore throat; pain or trouble passing urine -trouble passing urine or change in the amount of urine Side effects that usually do not require medical attention (report to your doctor or health care professional if they continue or are bothersome): -changes in emotions or mood -constipation -diarrhea -excessive hair growth on the face or body -headache -nausea, vomiting -trouble sleeping -weight gain This list may not describe all possible side effects. Call your doctor for medical advice about side effects. You may report side effects to FDA at 1-800-FDA-1088. Where should I keep my medicine? Keep out of the reach of children. Store at room temperature between 20 and 25 degrees C (68 and 77 degrees F). Throw away any unused medicine after the expiration date. NOTE: This sheet is a summary. It may not  cover all possible information. If you have questions about this medicine, talk to your doctor, pharmacist, or health care provider.  2018 Elsevier/Gold Standard (2015-08-23 15:53:30)   Sumatriptan injection What is this medicine? SUMATRIPTAN (soo ma TRIP tan) is used to treat migraines with or without aura. An aura is a strange feeling or visual disturbance that warns you of an attack. It is not used to prevent migraines. This medicine may be used for other purposes; ask your health care provider or pharmacist if you have questions. COMMON BRAND NAME(S): Alsuma, Imitrex, Imitrex Statdose, Sumavel DosePro System What should I tell my health care provider before I take this medicine? They need to know if you have any of these conditions: -circulation problems in fingers and toes -diabetes -heart disease -high blood pressure -high cholesterol -history of irregular heartbeat -history of stroke -kidney disease -liver disease -postmenopausal or surgical removal of uterus and ovaries -seizures -smoke tobacco -stomach or intestine problems -an unusual or allergic reaction to sumatriptan, other medicines, foods, dyes, or preservatives -pregnant or trying to get pregnant -breast-feeding How should I use this medicine? This medicine is for injection under the skin. Follow the directions on the prescription label. Only use this medicine at the first symptoms of a migraine. It is not for everyday use. If you are using an autoinjector, read the instruction leaflet carefully. A single injection is given just under the skin. Before you make an injection, clean and examine your skin. Do not inject at a place where the skin is damaged or infected. If your symptoms return you can use a second injection. If there is no  improvement at all in your symptoms after the first injection, call your doctor or health care professional. Wait at least 1 hour between doses and do not use more than 6 mg as a single  dose. Do not use more than 12 mg total in any 24 hour period. Do not use your medicine more often than directed. Talk to your pediatrician regarding the use of this medicine in children. Special care may be needed. Overdosage: If you think you have taken too much of this medicine contact a poison control center or emergency room at once. NOTE: This medicine is only for you. Do not share this medicine with others. What if I miss a dose? This does not apply; this medicine is not for regular use. What may interact with this medicine? Do not take this medicine with any of the following medicines: -cocaine -ergot alkaloids like dihydroergotamine, ergonovine, ergotamine, methylergonovine -feverfew -MAOIs like Carbex, Eldepryl, Marplan, Nardil, and Parnate -other medicines for migraine headache like almotriptan, eletriptan, frovatriptan, naratriptan, rizatriptan, zolmitriptan -tryptophan This medicine may also interact with the following medications: -certain medicines for depression, anxiety, or psychotic disturbances This list may not describe all possible interactions. Give your health care provider a list of all the medicines, herbs, non-prescription drugs, or dietary supplements you use. Also tell them if you smoke, drink alcohol, or use illegal drugs. Some items may interact with your medicine. What should I watch for while using this medicine? Only take this medicine for a migraine headache. Take it if you get warning symptoms or at the start of a migraine attack. It is not for regular use to prevent migraine attacks. You may get drowsy or dizzy. Do not drive, use machinery, or do anything that needs mental alertness until you know how this medicine affects you. Do not stand or sit up quickly, especially if you are an older patient. This reduces the risk of dizzy or fainting spells. Alcohol may interfere with the effect of this medicine. Avoid alcoholic drinks. Smoking cigarettes may increase the  risk of heart-related side effects from using this medicine. If you take migraine medicines for 10 or more days a month, your migraines may get worse. Keep a diary of headache days and medicine use. Contact your healthcare professional if your migraine attacks occur more frequently. What side effects may I notice from receiving this medicine? Side effects that you should report to your doctor or health care professional as soon as possible: -allergic reactions like skin rash, itching or hives, swelling of the face, lips, or tongue -bloody or watery diarrhea -hallucination, loss of contact with reality -pain, tingling, numbness in the face, hands, or feet -seizures -signs and symptoms of a blood clot such as breathing problems; changes in vision; chest pain; severe, sudden headache; pain, swelling, warmth in the leg; trouble speaking; sudden numbness or weakness of the face, arm, or leg -signs and symptoms of a dangerous change in heartbeat or heart rhythm like chest pain; dizziness; fast or irregular heartbeat; palpitations, feeling faint or lightheaded; falls; breathing problems -signs and symptoms of a stroke like changes in vision; confusion; trouble speaking or understanding; severe headaches; sudden numbness or weakness of the face, arm, or leg; trouble walking; dizziness; loss of balance or coordination -stomach pain Side effects that usually do not require medical attention (report to your doctor or health care professional if they continue or are bothersome): -changes in taste -facial flushing -headache -muscle cramps -muscle pain -nausea, vomiting -pain, redness, or irritation at site where  injected -weak or tired This list may not describe all possible side effects. Call your doctor for medical advice about side effects. You may report side effects to FDA at 1-800-FDA-1088. Where should I keep my medicine? Keep out of the reach of children. Store at room temperature between 2 and  30 degrees C (36 and 86 degrees F). Protect from light. Throw away any unused medicine after the expiration date. Make sure you receive a puncture-resistant container to dispose of the needles and syringes once you have finished with them. Do not reuse these items. Return the container to your health care professional for proper disposal. Keep the autoinjector device. NOTE: This sheet is a summary. It may not cover all possible information. If you have questions about this medicine, talk to your doctor, pharmacist, or health care provider.  2018 Elsevier/Gold Standard (2015-07-19 12:41:27)

## 2017-08-10 NOTE — Progress Notes (Signed)
[facility]    Provider:  Dr Jaynee Eagles Referring Provider: Cari Caraway, MD Primary Care Physician:  Cari Caraway, MD  CC:  Migraines  Interval history; Here for follow up of migraines. Doing exceptionally well. Having 26 headache free days in a month.  However she gets more migraines at the end of the month before her next Aimovig injection.  We discussed increasing Aimovig, increase aimovig to 2x monthly may even stagger the doses to every 2 weeks.  Patient is to continue Botox as well.  We discussed postponing Botox to see what the contribution is from both treatments and her migraine management.  The migraine she does have she wakes up with.  She has had a sleep test in the past and she does not have sleep apnea.  Migraines can occur in the early mornings.  However when migraines occur on waking, it is important to take a fast acting medication such as sumatriptan injections.  Taking a pill when you wake up with migraines is difficult because it takes a pill time to work.  Discussed other options such as sumatriptan powder, will call when sumatriptan injections.  She tried triptan's in the past, does not remember what happened or why she stopped taking them, denies any allergies or bad reactions to it.  Discussed the triptan's have the best evidence for acute management and this is likely what we should do.  HPI:  Heather Gardner is a 37 y.o. female here as a referral from Dr. Leta Baptist for intractable headaches.PMHx PCOS, depression. She has been having 26/30 days a month with headache for at least the last 6 months.16-17 a month are migrainous. Her migraines are more on the right side, pounding/throbbing, with nausea and vomiting, light sensitivity, motion worsens her migraines, going into a dark room and laying still really helps. They last 6-12 hours. She can wake with headaches that last for days. She has nocturnal; headaches on the right side around the eye or at the base  of her neck. The migraines can be severe 10/10 pain. They have been so painful her husband thought she was having a stroke. On average 6/10 but often severe. No aura. No medication overuse or OTC medication use. Triptans did not work at all.   Medications tried: TPX, Indomethacin, Neurontin, Maxalt, Lamictal, buspar, lexapro, metoprolol, zofran, imitre nasal spray, zomig  Reviewed notes, labs and imaging from outside physicians, which showed:  Reviewed MRI of the brain April 2017 images and agree with the following:  IMPRESSION:  This MRI of the brain with and without contrast shows the following: 1.    There are no acute findings. 2.    A right frontal draining vein is mildly enlarged though there are no significant developmental venous anomalies.   this is unlikely to be clinically significant.  Review of Systems: Patient complains of symptoms per HPI as well as the following symptoms:No CP, no SOB. Pertinent negatives per HPI. All others negative.   Social History   Socioeconomic History  . Marital status: Married    Spouse name: Not on file  . Number of children: 1  . Years of education: Not on file  . Highest education level: Master's degree (e.g., MA, MS, MEng, MEd, MSW, MBA)  Social Needs  . Financial resource strain: Not on file  . Food insecurity - worry: Not on file  . Food insecurity - inability: Not on file  . Transportation needs - medical: Not on file  . Transportation needs - non-medical:  Not on file  Occupational History  . Occupation: [employer]  Tobacco Use  . Smoking status: Former Smoker    Packs/day: 0.25    Types: Cigarettes    Last attempt to quit: 06/30/2016    Years since quitting: 1.1  . Smokeless tobacco: Never Used  . Tobacco comment: 08/22/15 smoke when stressed  Substance and Sexual Activity  . Alcohol use: No    Alcohol/week: 0.0 oz  . Drug use: No  . Sexual activity: Yes    Birth control/protection: IUD  Other Topics Concern  .  Not on file  Social History Narrative   Lives at home w/ her husband and son   Right-handed   Caffeine: rare    Family History  Problem Relation Age of Onset  . Heart disease Mother   . Cancer Mother        skin  . Alcohol abuse Mother   . Depression Mother   . Prostate cancer Father   . Throat cancer Father   . Cancer Father        skin  . Alcohol abuse Father   . Drug abuse Father   . Depression Father   . Bipolar disorder Sister   . Anxiety disorder Brother   . Kidney failure Maternal Grandmother   . Brain cancer Maternal Grandfather   . Lung cancer Maternal Grandfather   . Autism Son   . Anxiety disorder Paternal Grandmother     Past Medical History:  Diagnosis Date  . Anxiety   . Headache   . Kidney infection   . Major depression   . PCOS (polycystic ovarian syndrome)     Past Surgical History:  Procedure Laterality Date  . WISDOM TOOTH EXTRACTION  08/2015    Current Outpatient Medications  Medication Sig Dispense Refill  . Cholecalciferol (VITAMIN D3) 50000 units CAPS 50,000 Units every 7 (seven) days.  11  . clonazePAM (KLONOPIN) 1 MG tablet Take 1 mg by mouth 3 (three) times daily.    Eduard Roux (AIMOVIG) 70 MG/ML SOAJ Inject 140 mg into the skin every 30 (thirty) days. 2 pen 11  . FLUOXETINE HCL PO Take 25 mg at bedtime by mouth.    . fluticasone (FLONASE) 50 MCG/ACT nasal spray     . gabapentin (NEURONTIN) 300 MG capsule Take 1 capsule (300 mg total) by mouth 3 (three) times daily. 270 capsule 4  . indomethacin (INDOCIN) 50 MG capsule Take 1 capsule (50 mg total) by mouth 2 (two) times daily as needed. 60 capsule 6  . lamoTRIgine (LAMICTAL) 150 MG tablet Take 150 mg by mouth 2 (two) times daily.    Marland Kitchen levonorgestrel (MIRENA) 20 MCG/24HR IUD 1 each by Intrauterine route continuous.    . methylphenidate (RITALIN) 20 MG tablet TAKE 1 TABLET BY MOUTH EVERY MORNING, 1 TABLET AT NOON, AND 1 TABLET AT 4PM  0  . OLANZapine (ZYPREXA) 15 MG tablet Take 15 mg  at bedtime by mouth.    . ondansetron (ZOFRAN) 4 MG tablet Take 1 tablet (4 mg total) by mouth every 8 (eight) hours as needed for nausea or vomiting. 30 tablet 6  . propranolol (INDERAL) 20 MG tablet Take 1 tablet (20 mg total) by mouth 2 (two) times daily. 180 tablet 1  . Topiramate ER 100 MG CP24 Take 100 mg by mouth at bedtime. 90 capsule 4  . methylPREDNISolone (MEDROL DOSEPAK) 4 MG TBPK tablet follow package directions 21 tablet 1  . SUMAtriptan (IMITREX) 6 MG/0.5ML SOLN  injection Take one dose at headache onset, can take additional dose 2hrs later if needed. No more then 2 injections in 24hrs 10 vial 11   No current facility-administered medications for this visit.     Allergies as of 08/10/2017 - Review Complete 08/10/2017  Allergen Reaction Noted  . Asa [aspirin] Other (See Comments) 05/05/2012    Vitals: BP 110/69 (BP Location: Right Arm, Patient Position: Sitting)   Pulse 82   Ht 5\' 10"  (1.778 m)   Wt 245 lb (111.1 kg)   BMI 35.15 kg/m  Last Weight:  Wt Readings from Last 1 Encounters:  08/10/17 245 lb (111.1 kg)   Last Height:   Ht Readings from Last 1 Encounters:  08/10/17 5\' 10"  (1.778 m)    Physical exam: Exam: Gen: NAD, conversant, well nourised, obese, well groomed                     CV: RRR, no MRG. No Carotid Bruits. No peripheral edema, warm, nontender Eyes: Conjunctivae clear without exudates or hemorrhage  Neuro: Detailed Neurologic Exam  Speech:    Speech is normal; fluent and spontaneous with normal comprehension.  Cognition:    The patient is oriented to person, place, and time;     recent and remote memory intact;     language fluent;     normal attention, concentration,     fund of knowledge Cranial Nerves:    The pupils are equal, round, and reactive to light. The fundi are normal and spontaneous venous pulsations are present. Visual fields are full to finger confrontation. Extraocular movements are intact. Trigeminal sensation is intact  and the muscles of mastication are normal. The face is symmetric. The palate elevates in the midline. Hearing intact. Voice is normal. Shoulder shrug is normal. The tongue has normal motion without fasciculations.   Coordination:    No dysmetria  Motor Observation:    No asymmetry, no atrophy, and no involuntary movements noted. Tone:    Normal muscle tone.    Posture:    Posture is normal. normal erect    Strength:    Strength is V/V in the upper and lower limbs.       Assessment/Plan:  37 year old patient with chronic migraines without aura and without status migrainosus not intractable who is here for follow-up. She had failed multiple medications and continued to have a high burden of almost daily headaches.  Patient was started on Botox as well as the new C GRP medications she says it has been life altering.    Meds tried: TPX, Indomethacin, Neurontin, Maxalt, Lamictal, buspar, lexapro, metoprolol, zofran, gabapentin, Zyprexa, Zofran,, imitrex nasal spray, zomig. Discussed Botox and provided literature for patient to read. We'll recommend approval from insurance. We'll also continue propranolol 20 mg twice daily but cautioned about low blood pressure and to stop for any side effects especially dizziness, chest pain, fatigue or any other side effects.  - increase aimovig to 140 a month, she may stagger these doses - Imitrex injections, patient wakes up with migraines, she needs a fast acting acute management medication, will order Imitrex subcu injections - We will provide steroid pack and if patient has a prolonged migraine or multiple migraines can use this for 6 days  Discussed: To prevent or relieve headaches, try the following: Cool Compress. Lie down and place a cool compress on your head.  Avoid headache triggers. If certain foods or odors seem to have triggered your migraines in the past, avoid  them. A headache diary might help you identify triggers.  Include physical activity  in your daily routine. Try a daily walk or other moderate aerobic exercise.  Manage stress. Find healthy ways to cope with the stressors, such as delegating tasks on your to-do list.  Practice relaxation techniques. Try deep breathing, yoga, massage and visualization.  Eat regularly. Eating regularly scheduled meals and maintaining a healthy diet might help prevent headaches. Also, drink plenty of fluids.  Follow a regular sleep schedule. Sleep deprivation might contribute to headaches Consider biofeedback. With this mind-body technique, you learn to control certain bodily functions - such as muscle tension, heart rate and blood pressure - to prevent headaches or reduce headache pain.    Proceed to emergency room if you experience new or worsening symptoms or symptoms do not resolve, if you have new neurologic symptoms or if headache is severe, or for any concerning symptom.   Provided education and documentation from American headache Society toolbox including articles on: chronic migraine medication overuse headache, chronic migraines, prevention of migraines, behavioral and other nonpharmacologic treatments for headache.    Sarina Ill, MD  [facility] [address] [place], [place] [address]  Phone [phone] Fax [phone]  A total of 25 minutes was spent in with this patient face-to-face. Over half this time was spent on counseling patient on the migraine diagnosis and different therapeutic options available.

## 2017-09-15 ENCOUNTER — Other Ambulatory Visit: Payer: Self-pay | Admitting: *Deleted

## 2017-09-15 MED ORDER — INDOMETHACIN 50 MG PO CAPS: 50.0000 mg | ORAL_CAPSULE | Freq: Two times a day (BID) | ORAL | 1 refills | Status: DC | PRN

## 2017-09-16 NOTE — Telephone Encounter (Signed)
Even though pt is receiving Aimovig through [employer], RN completed new PA on Cover My Meds for Aimovig under pt's new insurance. Key: [id number]. Anticipate a determination within 24 hours from [facility].

## 2017-09-17 NOTE — Telephone Encounter (Addendum)
Called pt & LVM asking for call back. Left office number in message.  °

## 2017-09-17 NOTE — Telephone Encounter (Signed)
Pt returning RNs call is aware of what had been noted.

## 2017-09-17 NOTE — Telephone Encounter (Signed)
She can continue to get Aimovig however let her know when the bridge program is done if insurance does not cover there meds we may need to order Ajovy or emgality. Still, not certain insurance will pay for those either. I have been upfront and let all my patients know this, unclear what will happen with insurance. thanks

## 2017-09-17 NOTE — Telephone Encounter (Signed)
Received determination from [facility] that Aimovig has been denied due to patient having not tried/failed Ajovy or Emgality.

## 2018-01-19 ENCOUNTER — Telehealth: Payer: Self-pay | Admitting: Neurology

## 2018-01-19 MED ORDER — ERENUMAB-AOOE 140 MG/ML ~~LOC~~ SOAJ: 140.0000 mg | SUBCUTANEOUS | 11 refills | Status: DC

## 2018-01-19 NOTE — Addendum Note (Signed)
Addended by: Gildardo Griffes on: 01/19/2018 05:11 PM   Modules accepted: Orders

## 2018-01-19 NOTE — Telephone Encounter (Signed)
[facility] [address] [facility] [employer] (AIMOVIG) 69 MG/ML SOAJ. She was told in order to use discount card Rx is to be written 140mg  per ml. A returned call is not needed unless there are questions.

## 2018-01-19 NOTE — Telephone Encounter (Signed)
Spoke with pt. She is completely fine to try the 140 mg dose in a single syringe and see how that works since it will be one shot instead of two. She will contact us if it doesn't last her the month to see about trying another PA or possibly switching to Ajovy or Emgality. Order e-scribed to pharmacy.

## 2018-02-04 ENCOUNTER — Telehealth: Payer: Self-pay | Admitting: *Deleted

## 2018-02-04 NOTE — Telephone Encounter (Signed)
Completed PA for Aimovig 140 mg. KEY: Key: [id number]. Awaiting determination.

## 2018-02-10 NOTE — Telephone Encounter (Signed)
Received determination that Aimovig 140 mg injection is denied again due to requirements of trials of Emgality and Ajovy first. Last spoke with pt on 7/23 and she was going to continue with Aimovig and try the 140 mg pen and will call back if it does not work and consider switching to [employer] or Ajovy.

## 2018-08-12 ENCOUNTER — Ambulatory Visit: Payer: BC Managed Care – PPO | Admitting: Neurology

## 2018-08-23 ENCOUNTER — Telehealth: Payer: Self-pay | Admitting: *Deleted

## 2018-08-23 MED ORDER — TOPIRAMATE ER 25 MG PO SPRINKLE CAP24: EXTENDED_RELEASE_CAPSULE | ORAL | 0 refills | Status: DC

## 2018-08-23 NOTE — Telephone Encounter (Signed)
Pt came by office and was given samples of Qudexy XR 25 mg capsules by Dr. Jaynee Eagles. Per Dr. Jaynee Eagles, pt is weaning and will follow this schedule: Take 75 mg daily x 1 week, then  Take 50 mg daily x 1 week, then  Take 25 mg daily x 1-2 weeks.  Sample order placed.

## 2018-12-03 DIAGNOSIS — F3131 Bipolar disorder, current episode depressed, mild: Secondary | ICD-10-CM | POA: Diagnosis not present

## 2018-12-03 DIAGNOSIS — F900 Attention-deficit hyperactivity disorder, predominantly inattentive type: Secondary | ICD-10-CM | POA: Diagnosis not present

## 2018-12-03 DIAGNOSIS — F3111 Bipolar disorder, current episode manic without psychotic features, mild: Secondary | ICD-10-CM | POA: Diagnosis not present

## 2018-12-15 DIAGNOSIS — F333 Major depressive disorder, recurrent, severe with psychotic symptoms: Secondary | ICD-10-CM | POA: Diagnosis not present

## 2018-12-23 DIAGNOSIS — F333 Major depressive disorder, recurrent, severe with psychotic symptoms: Secondary | ICD-10-CM | POA: Diagnosis not present

## 2018-12-30 ENCOUNTER — Encounter: Payer: Self-pay | Admitting: *Deleted

## 2019-01-17 ENCOUNTER — Telehealth: Payer: Self-pay | Admitting: Neurology

## 2019-01-17 NOTE — Telephone Encounter (Signed)
[email or web address] called to inform RN Romelle Starcher that the clinical questions are available to complete for the Aimovig. The Refrence Key:[id number]

## 2019-01-17 NOTE — Telephone Encounter (Signed)
Noted, awaiting pt response through mychart.

## 2019-01-20 ENCOUNTER — Encounter: Payer: Self-pay | Admitting: *Deleted

## 2019-01-20 NOTE — Telephone Encounter (Addendum)
Per CMM, Aimovig approved Effective from 01/20/2019 through 04/19/2019. Sent pt a Therapist, music.

## 2019-01-20 NOTE — Telephone Encounter (Signed)
PA pending on CMM. Key: [id number]. Awaiting [facility] determination.

## 2019-01-25 DIAGNOSIS — F3132 Bipolar disorder, current episode depressed, moderate: Secondary | ICD-10-CM | POA: Diagnosis not present

## 2019-01-25 DIAGNOSIS — F3174 Bipolar disorder, in full remission, most recent episode manic: Secondary | ICD-10-CM | POA: Diagnosis not present

## 2019-02-01 DIAGNOSIS — F333 Major depressive disorder, recurrent, severe with psychotic symptoms: Secondary | ICD-10-CM | POA: Diagnosis not present

## 2019-02-15 ENCOUNTER — Other Ambulatory Visit: Payer: Self-pay | Admitting: *Deleted

## 2019-02-15 MED ORDER — SUMATRIPTAN SUCCINATE 100 MG PO TABS: 100.0000 mg | ORAL_TABLET | ORAL | 11 refills | Status: DC | PRN

## 2019-02-15 MED ORDER — AIMOVIG 140 MG/ML ~~LOC~~ SOAJ: 140.0000 mg | SUBCUTANEOUS | 0 refills | Status: DC

## 2019-02-15 NOTE — Telephone Encounter (Signed)
I spoke with pt and advised her the infusion nurse can take her at 9 AM tomorrow morning. The pt verbalized appreciation. Pt understands she will likely be given Toradol, Solumedrol, Compazine, Depacon. She stated she took a medrol dosepack, finished 5 days ago. She understands to bring a mask. She was given a refill of Aimovig x 1. She asked for a Sumatriptan oral prescription d/t the discomfort of the injection.  I spoke with Dr. Jaynee Eagles. Orders received for Depacon 1 gram IV x 1 push, Toradol 30 mg IV x 1 push, Compazine 10 mg IV x 1 push, Benadryl 25 mg IV x 1 push. Orders signed and given to infusion nurse along with demographics sheet and insurance information. Dr. Jaynee Eagles also authorized Sumatriptan 100 mg PO q2h for migraine, max of 2 per day. Dispense 10 tablets, refills 11.   Called pt back and discussed the Sumatriptan prescription instructions. Discussed common side effects and advised there would be a full list with the medication. Pt advised to call with any questions or concerns, for any emergencies, call 911. Pt verbalized appreciation and understanding. [facility] [address]   Sumatriptan ordered.

## 2019-02-15 NOTE — Telephone Encounter (Signed)
Spoke with Dr. Jaynee Eagles. No availabilities but will discuss possible migraine infusion tomorrow with the IV nurse.

## 2019-02-24 DIAGNOSIS — Z5181 Encounter for therapeutic drug level monitoring: Secondary | ICD-10-CM | POA: Diagnosis not present

## 2019-02-24 DIAGNOSIS — Z79899 Other long term (current) drug therapy: Secondary | ICD-10-CM | POA: Diagnosis not present

## 2019-02-24 DIAGNOSIS — Z1329 Encounter for screening for other suspected endocrine disorder: Secondary | ICD-10-CM | POA: Diagnosis not present

## 2019-02-24 DIAGNOSIS — Z130 Encounter for screening for diseases of the blood and blood-forming organs and certain disorders involving the immune mechanism: Secondary | ICD-10-CM | POA: Diagnosis not present

## 2019-03-01 ENCOUNTER — Other Ambulatory Visit: Payer: Self-pay

## 2019-03-01 ENCOUNTER — Encounter: Payer: Self-pay | Admitting: Neurology

## 2019-03-01 ENCOUNTER — Ambulatory Visit: Payer: Self-pay | Admitting: Neurology

## 2019-03-01 VITALS — BP 122/76 | HR 112 | Temp 97.7°F | Ht 70.0 in | Wt 247.0 lb

## 2019-03-01 DIAGNOSIS — G43711 Chronic migraine without aura, intractable, with status migrainosus: Secondary | ICD-10-CM

## 2019-03-01 MED ORDER — SUMATRIPTAN SUCCINATE 6 MG/0.5ML ~~LOC~~ SOLN: SUBCUTANEOUS | 11 refills | Status: DC

## 2019-03-01 MED ORDER — METHYLPREDNISOLONE 4 MG PO TBPK: ORAL_TABLET | ORAL | 1 refills | Status: DC

## 2019-03-01 MED ORDER — SUMATRIPTAN SUCCINATE 100 MG PO TABS: 100.0000 mg | ORAL_TABLET | ORAL | 11 refills | Status: DC | PRN

## 2019-03-01 MED ORDER — ONDANSETRON HCL 4 MG PO TABS: 4.0000 mg | ORAL_TABLET | Freq: Three times a day (TID) | ORAL | 6 refills | Status: DC | PRN

## 2019-03-01 MED ORDER — AIMOVIG 140 MG/ML ~~LOC~~ SOAJ: 140.0000 mg | SUBCUTANEOUS | 11 refills | Status: DC

## 2019-03-01 NOTE — Progress Notes (Signed)
[id number]    Provider:  Dr Jaynee Eagles Referring Provider: Cari Caraway, MD Primary Care Physician:  Cari Caraway, MD  CC:  Migraines  Interval history: There was an insurance issue and she could not get her Aimovig ad the beginning of August she had the most severe migraines, severe vomiting, she had an infusion and she was better, she did get the Aimovig but not helping. Migraines got worse with Lithium and she was taken off of the Lithium. She stopped the Lithium last night. She restarted Aimovig and we need to wait, she  was shaking, her son was scared. Father recently diagnosed with ewy Body Dementia and has been stressful. She has since restarted Aimovig. Sumatriptan injections work. The pill works as well. Will refill all meds.  Interval history 07/2017; Here for follow up of migraines. Doing exceptionally well. Having 26 headache free days in a month.  However she gets more migraines at the end of the month before her next Aimovig injection.  We discussed increasing Aimovig, increase aimovig to 2x monthly may even stagger the doses to every 2 weeks.  Patient is to continue Botox as well.  We discussed postponing Botox to see what the contribution is from both treatments and her migraine management.  The migraine she does have she wakes up with.  She has had a sleep test in the past and she does not have sleep apnea.  Migraines can occur in the early mornings.  However when migraines occur on waking, it is important to take a fast acting medication such as sumatriptan injections.  Taking a pill when you wake up with migraines is difficult because it takes a pill time to work.  Discussed other options such as sumatriptan powder, will call when sumatriptan injections.  She tried triptan's in the past, does not remember what happened or why she stopped taking them, denies any allergies or bad reactions to it.  Discussed the triptan's have the best evidence for acute management and  this is likely what we should do.  HPI:  Heather Gardner is a 38 y.o. female here as a referral from Dr. Leta Baptist for intractable headaches.PMHx PCOS, depression. She has been having 26/30 days a month with headache for at least the last 6 months.16-17 a month are migrainous. Her migraines are more on the right side, pounding/throbbing, with nausea and vomiting, light sensitivity, motion worsens her migraines, going into a dark room and laying still really helps. They last 6-12 hours. She can wake with headaches that last for days. She has nocturnal; headaches on the right side around the eye or at the base of her neck. The migraines can be severe 10/10 pain. They have been so painful her husband thought she was having a stroke. On average 6/10 but often severe. No aura. No medication overuse or OTC medication use. Triptans did not work at all.   Medications tried: TPX, Indomethacin, Neurontin, Maxalt, Lamictal, buspar, lexapro, metoprolol, zofran, imitre nasal spray, zomig  Reviewed notes, labs and imaging from outside physicians, which showed:  Reviewed MRI of the brain April 2017 images and agree with the following:  IMPRESSION:  This MRI of the brain with and without contrast shows the following: 1.    There are no acute findings. 2.    A right frontal draining vein is mildly enlarged though there are no significant developmental venous anomalies.   this is unlikely to be clinically significant.  Review of Systems: Patient complains of symptoms per HPI as  well as the following symptoms:No CP, no SOB. Pertinent negatives per HPI. All others negative.   Social History   Socioeconomic History  . Marital status: Married    Spouse name: Not on file  . Number of children: 1  . Years of education: Not on file  . Highest education level: Master's degree (e.g., MA, MS, MEng, MEd, MSW, MBA)  Occupational History  . Occupation: [employer]  . Financial resource  strain: Not on file  . Food insecurity    Worry: Not on file    Inability: Not on file  . Transportation needs    Medical: Not on file    Non-medical: Not on file  Tobacco Use  . Smoking status: Former Smoker    Packs/day: 0.25    Types: Cigarettes    Quit date: 06/30/2016    Years since quitting: 2.6  . Smokeless tobacco: Never Used  . Tobacco comment: 08/22/15 smoke when stressed  Substance and Sexual Activity  . Alcohol use: No    Alcohol/week: 0.0 standard drinks  . Drug use: No  . Sexual activity: Yes    Birth control/protection: I.U.D.  Lifestyle  . Physical activity    Days per week: Not on file    Minutes per session: Not on file  . Stress: Not on file  Relationships  . Social Herbalist on phone: Not on file    Gets together: Not on file    Attends religious service: Not on file    Active member of club or organization: Not on file    Attends meetings of clubs or organizations: Not on file    Relationship status: Not on file  . Intimate partner violence    Fear of current or ex partner: Not on file    Emotionally abused: Not on file    Physically abused: Not on file    Forced sexual activity: Not on file  Other Topics Concern  . Not on file  Social History Narrative   Lives at home w/ her husband and son   Right-handed   Caffeine: rare    Family History  Problem Relation Age of Onset  . Heart disease Mother   . Cancer Mother        skin  . Alcohol abuse Mother   . Depression Mother   . Prostate cancer Father   . Throat cancer Father   . Cancer Father        skin  . Alcohol abuse Father   . Drug abuse Father   . Depression Father   . Dementia Father        lewey body  . Bipolar disorder Sister   . Anxiety disorder Brother   . Kidney failure Maternal Grandmother   . Brain cancer Maternal Grandfather   . Lung cancer Maternal Grandfather   . Autism Son   . Anxiety disorder Paternal Grandmother     Past Medical History:  Diagnosis  Date  . Anxiety   . Headache   . Kidney infection   . Major depression   . PCOS (polycystic ovarian syndrome)     Past Surgical History:  Procedure Laterality Date  . WISDOM TOOTH EXTRACTION  08/2015    Current Outpatient Medications  Medication Sig Dispense Refill  . Cholecalciferol (VITAMIN D3) 50000 units CAPS 50,000 Units every 7 (seven) days.  11  . clonazePAM (KLONOPIN) 1 MG tablet Take 1 mg by mouth 3 (three) times daily.    Marland Kitchen  Erenumab-aooe (AIMOVIG) 140 MG/ML SOAJ Inject 140 mg into the skin every 30 (thirty) days. 1 pen 11  . fluticasone (FLONASE) 50 MCG/ACT nasal spray     . lamoTRIgine (LAMICTAL) 200 MG tablet Take 400 mg by mouth daily.    Marland Kitchen levonorgestrel (MIRENA) 20 MCG/24HR IUD 1 each by Intrauterine route continuous.    . methylphenidate (RITALIN) 20 MG tablet TAKE 1 TABLET BY MOUTH EVERY MORNING, 1 TABLET AT NOON, AND 1 TABLET AT 4PM  0  . ondansetron (ZOFRAN) 4 MG tablet Take 1 tablet (4 mg total) by mouth every 8 (eight) hours as needed for nausea or vomiting. 30 tablet 6  . SUMAtriptan (IMITREX) 100 MG tablet Take 1 tablet (100 mg total) by mouth as needed for migraine. May repeat in 2 hours if headache persists or recurs. Max of 2 tablets in 24 hours. Do not take with sumatriptan injection. 10 tablet 11  . SUMAtriptan (IMITREX) 6 MG/0.5ML SOLN injection Take one dose at headache onset, can take additional dose 2hrs later if needed. No more then 2 injections in 24hrs 6 mL 11  . lithium carbonate (ESKALITH) 450 MG CR tablet Take 2 tablets by mouth every evening.    . methylPREDNISolone (MEDROL DOSEPAK) 4 MG TBPK tablet follow package directions 21 tablet 1   No current facility-administered medications for this visit.     Allergies as of 03/01/2019 - Review Complete 03/01/2019  Allergen Reaction Noted  . Asa [aspirin] Other (See Comments) 05/05/2012    Vitals: BP 122/76 (BP Location: Right Arm, Patient Position: Sitting)   Pulse (!) 112 Comment: pt reports  being nervous  Temp 97.7 F (36.5 C) Comment: taken by check-in staff  Ht 5\' 10"  (1.778 m)   Wt 247 lb (112 kg)   BMI 35.44 kg/m  Last Weight:  Wt Readings from Last 1 Encounters:  03/01/19 247 lb (112 kg)   Last Height:   Ht Readings from Last 1 Encounters:  03/01/19 5\' 10"  (1.778 m)    Physical exam: Exam: Gen: NAD, conversant, well nourised, obese, well groomed                     CV: RRR, no MRG. No Carotid Bruits. No peripheral edema, warm, nontender Eyes: Conjunctivae clear without exudates or hemorrhage  Neuro: Detailed Neurologic Exam  Speech:    Speech is normal; fluent and spontaneous with normal comprehension.  Cognition:    The patient is oriented to person, place, and time;     recent and remote memory intact;     language fluent;     normal attention, concentration,     fund of knowledge Cranial Nerves:    The pupils are equal, round, and reactive to light. The fundi are normal and spontaneous venous pulsations are present. Visual fields are full to finger confrontation. Extraocular movements are intact. Trigeminal sensation is intact and the muscles of mastication are normal. The face is symmetric. The palate elevates in the midline. Hearing intact. Voice is normal. Shoulder shrug is normal. The tongue has normal motion without fasciculations.   Coordination:    No dysmetria  Motor Observation:    No asymmetry, no atrophy, and no involuntary movements noted. Tone:    Normal muscle tone.    Posture:    Posture is normal. normal erect    Strength:    Strength is V/V in the upper and lower limbs.       Assessment/Plan:  38 year old patient with chronic migraines  without aura and without status migrainosus not intractable who is here for follow-up. She had failed multiple medications and continued to have a high burden of almost daily headaches.  Patient was started on the new C GRP medications she says it has been life altering.    Continue current  meds, will refill  Meds ordered this encounter  Medications  . SUMAtriptan (IMITREX) 100 MG tablet    Sig: Take 1 tablet (100 mg total) by mouth as needed for migraine. May repeat in 2 hours if headache persists or recurs. Max of 2 tablets in 24 hours. Do not take with sumatriptan injection.    Dispense:  10 tablet    Refill:  11    Updated rx.  . ondansetron (ZOFRAN) 4 MG tablet    Sig: Take 1 tablet (4 mg total) by mouth every 8 (eight) hours as needed for nausea or vomiting.    Dispense:  30 tablet    Refill:  6  . SUMAtriptan (IMITREX) 6 MG/0.5ML SOLN injection    Sig: Take one dose at headache onset, can take additional dose 2hrs later if needed. No more then 2 injections in 24hrs    Dispense:  6 mL    Refill:  11    Auto injector please. Dispense 10 vials which would be 5 boxes or max allowed monthly by insurance.  Eduard Roux (AIMOVIG) 140 MG/ML SOAJ    Sig: Inject 140 mg into the skin every 30 (thirty) days.    Dispense:  1 pen    Refill:  11  . methylPREDNISolone (MEDROL DOSEPAK) 4 MG TBPK tablet    Sig: follow package directions    Dispense:  21 tablet    Refill:  1       Meds tried: TPX, Indomethacin, Neurontin, Maxalt, Lamictal, buspar, lexapro, metoprolol, zofran, gabapentin, Zyprexa, Zofran,, imitrex nasal spray, zomig. Discussed Botox and provided literature for patient to read. We'll recommend approval from insurance. We'll also continue propranolol 20 mg twice daily but cautioned about low blood pressure and to stop for any side effects especially dizziness, chest pain, fatigue or any other side effects.  Discussed: To prevent or relieve headaches, try the following: Cool Compress. Lie down and place a cool compress on your head.  Avoid headache triggers. If certain foods or odors seem to have triggered your migraines in the past, avoid them. A headache diary might help you identify triggers.  Include physical activity in your daily routine. Try a daily walk  or other moderate aerobic exercise.  Manage stress. Find healthy ways to cope with the stressors, such as delegating tasks on your to-do list.  Practice relaxation techniques. Try deep breathing, yoga, massage and visualization.  Eat regularly. Eating regularly scheduled meals and maintaining a healthy diet might help prevent headaches. Also, drink plenty of fluids.  Follow a regular sleep schedule. Sleep deprivation might contribute to headaches Consider biofeedback. With this mind-body technique, you learn to control certain bodily functions - such as muscle tension, heart rate and blood pressure - to prevent headaches or reduce headache pain.    Proceed to emergency room if you experience new or worsening symptoms or symptoms do not resolve, if you have new neurologic symptoms or if headache is severe, or for any concerning symptom.   Provided education and documentation from American headache Society toolbox including articles on: chronic migraine medication overuse headache, chronic migraines, prevention of migraines, behavioral and other nonpharmacologic treatments for headache.    Sarina Ill, MD  [facility] [address] [place], [place] [address]  Phone [phone] Fax [phone]  A total of 15 minutes was spent face-to-face with this patient. Over half this time was spent on counseling patient on the  1. Chronic migraine without aura, with intractable migraine, so stated, with status migrainosus    diagnosis and different diagnostic and therapeutic options, counseling and coordination of care, risks ans benefits of management, compliance, or risk factor reduction and education.

## 2019-03-10 DIAGNOSIS — F333 Major depressive disorder, recurrent, severe with psychotic symptoms: Secondary | ICD-10-CM | POA: Diagnosis not present

## 2019-03-11 DIAGNOSIS — R7309 Other abnormal glucose: Secondary | ICD-10-CM | POA: Diagnosis not present

## 2019-03-14 ENCOUNTER — Ambulatory Visit
Admission: RE | Admit: 2019-03-14 | Discharge: 2019-03-14 | Disposition: A | Discharge status: Home / Self Care | Payer: BLUE CROSS/BLUE SHIELD | Source: Ambulatory Visit | Attending: Family Medicine | Admitting: Family Medicine

## 2019-03-14 ENCOUNTER — Other Ambulatory Visit: Payer: Self-pay | Admitting: Family Medicine

## 2019-03-14 DIAGNOSIS — R109 Unspecified abdominal pain: Secondary | ICD-10-CM

## 2019-03-14 DIAGNOSIS — R1110 Vomiting, unspecified: Secondary | ICD-10-CM

## 2019-03-14 DIAGNOSIS — K7689 Other specified diseases of liver: Secondary | ICD-10-CM | POA: Diagnosis not present

## 2019-03-14 DIAGNOSIS — R110 Nausea: Secondary | ICD-10-CM

## 2019-03-14 DIAGNOSIS — R112 Nausea with vomiting, unspecified: Secondary | ICD-10-CM | POA: Diagnosis not present

## 2019-03-14 MED ORDER — IOPAMIDOL (ISOVUE-300) INJECTION 61%
125.0000 mL | Freq: Once | INTRAVENOUS | Status: AC | PRN
  Administered 2019-03-14: 125 mL via INTRAVENOUS

## 2019-03-22 DIAGNOSIS — F3132 Bipolar disorder, current episode depressed, moderate: Secondary | ICD-10-CM | POA: Diagnosis not present

## 2019-03-30 DIAGNOSIS — R112 Nausea with vomiting, unspecified: Secondary | ICD-10-CM | POA: Diagnosis not present

## 2019-03-30 DIAGNOSIS — K625 Hemorrhage of anus and rectum: Secondary | ICD-10-CM | POA: Diagnosis not present

## 2019-03-31 DIAGNOSIS — K602 Anal fissure, unspecified: Secondary | ICD-10-CM | POA: Diagnosis not present

## 2019-03-31 DIAGNOSIS — R112 Nausea with vomiting, unspecified: Secondary | ICD-10-CM | POA: Diagnosis not present

## 2019-04-11 ENCOUNTER — Telehealth: Payer: Self-pay

## 2019-04-11 NOTE — Telephone Encounter (Signed)
PA for Aimovig  Your information has been submitted to [place]. [employer] [place] will review the request and fax you a determination directly, typically within 3 business days of your submission once all necessary information is received. If [employer] [place] has not responded in 3 business days or if you have any questions about your submission, contact [place] at [phone].

## 2019-04-12 DIAGNOSIS — F333 Major depressive disorder, recurrent, severe with psychotic symptoms: Secondary | ICD-10-CM | POA: Diagnosis not present

## 2019-04-12 NOTE — Telephone Encounter (Signed)
Effective from 04/11/2019 through 04/09/2020 for Aimovig.

## 2019-04-12 NOTE — Telephone Encounter (Signed)
Faxed approval letter to pharmacy. Received a receipt of confirmation.

## 2019-04-28 DIAGNOSIS — K5900 Constipation, unspecified: Secondary | ICD-10-CM | POA: Diagnosis not present

## 2019-05-11 DIAGNOSIS — F3174 Bipolar disorder, in full remission, most recent episode manic: Secondary | ICD-10-CM | POA: Diagnosis not present

## 2019-05-11 DIAGNOSIS — F3132 Bipolar disorder, current episode depressed, moderate: Secondary | ICD-10-CM | POA: Diagnosis not present

## 2019-05-11 DIAGNOSIS — F900 Attention-deficit hyperactivity disorder, predominantly inattentive type: Secondary | ICD-10-CM | POA: Diagnosis not present

## 2019-06-16 DIAGNOSIS — F333 Major depressive disorder, recurrent, severe with psychotic symptoms: Secondary | ICD-10-CM | POA: Diagnosis not present

## 2019-06-17 DIAGNOSIS — Z03818 Encounter for observation for suspected exposure to other biological agents ruled out: Secondary | ICD-10-CM | POA: Diagnosis not present

## 2019-06-17 DIAGNOSIS — Z20828 Contact with and (suspected) exposure to other viral communicable diseases: Secondary | ICD-10-CM | POA: Diagnosis not present

## 2019-07-05 DIAGNOSIS — F3131 Bipolar disorder, current episode depressed, mild: Secondary | ICD-10-CM | POA: Diagnosis not present

## 2019-07-05 DIAGNOSIS — F3111 Bipolar disorder, current episode manic without psychotic features, mild: Secondary | ICD-10-CM | POA: Diagnosis not present

## 2019-07-06 DIAGNOSIS — F333 Major depressive disorder, recurrent, severe with psychotic symptoms: Secondary | ICD-10-CM | POA: Diagnosis not present

## 2019-07-12 DIAGNOSIS — F333 Major depressive disorder, recurrent, severe with psychotic symptoms: Secondary | ICD-10-CM | POA: Diagnosis not present

## 2019-07-20 DIAGNOSIS — F333 Major depressive disorder, recurrent, severe with psychotic symptoms: Secondary | ICD-10-CM | POA: Diagnosis not present

## 2019-07-27 DIAGNOSIS — F333 Major depressive disorder, recurrent, severe with psychotic symptoms: Secondary | ICD-10-CM | POA: Diagnosis not present

## 2019-09-08 DIAGNOSIS — F333 Major depressive disorder, recurrent, severe with psychotic symptoms: Secondary | ICD-10-CM | POA: Diagnosis not present

## 2019-10-03 DIAGNOSIS — F3111 Bipolar disorder, current episode manic without psychotic features, mild: Secondary | ICD-10-CM | POA: Diagnosis not present

## 2019-10-03 DIAGNOSIS — F3132 Bipolar disorder, current episode depressed, moderate: Secondary | ICD-10-CM | POA: Diagnosis not present

## 2019-10-04 DIAGNOSIS — F333 Major depressive disorder, recurrent, severe with psychotic symptoms: Secondary | ICD-10-CM | POA: Diagnosis not present

## 2019-10-11 DIAGNOSIS — F333 Major depressive disorder, recurrent, severe with psychotic symptoms: Secondary | ICD-10-CM | POA: Diagnosis not present

## 2019-10-17 DIAGNOSIS — F333 Major depressive disorder, recurrent, severe with psychotic symptoms: Secondary | ICD-10-CM | POA: Diagnosis not present

## 2019-10-24 DIAGNOSIS — F333 Major depressive disorder, recurrent, severe with psychotic symptoms: Secondary | ICD-10-CM | POA: Diagnosis not present

## 2019-10-25 DIAGNOSIS — F3132 Bipolar disorder, current episode depressed, moderate: Secondary | ICD-10-CM | POA: Diagnosis not present

## 2019-10-25 DIAGNOSIS — F900 Attention-deficit hyperactivity disorder, predominantly inattentive type: Secondary | ICD-10-CM | POA: Diagnosis not present

## 2019-10-25 DIAGNOSIS — F3111 Bipolar disorder, current episode manic without psychotic features, mild: Secondary | ICD-10-CM | POA: Diagnosis not present

## 2019-10-27 DIAGNOSIS — Z1329 Encounter for screening for other suspected endocrine disorder: Secondary | ICD-10-CM | POA: Diagnosis not present

## 2019-10-27 DIAGNOSIS — F319 Bipolar disorder, unspecified: Secondary | ICD-10-CM | POA: Diagnosis not present

## 2019-10-27 DIAGNOSIS — R5383 Other fatigue: Secondary | ICD-10-CM | POA: Diagnosis not present

## 2019-11-01 DIAGNOSIS — F333 Major depressive disorder, recurrent, severe with psychotic symptoms: Secondary | ICD-10-CM | POA: Diagnosis not present

## 2019-11-07 DIAGNOSIS — F333 Major depressive disorder, recurrent, severe with psychotic symptoms: Secondary | ICD-10-CM | POA: Diagnosis not present

## 2019-11-15 ENCOUNTER — Ambulatory Visit: Payer: BLUE CROSS/BLUE SHIELD | Admitting: Obstetrics & Gynecology

## 2019-11-15 DIAGNOSIS — F333 Major depressive disorder, recurrent, severe with psychotic symptoms: Secondary | ICD-10-CM | POA: Diagnosis not present

## 2019-11-22 DIAGNOSIS — F333 Major depressive disorder, recurrent, severe with psychotic symptoms: Secondary | ICD-10-CM | POA: Diagnosis not present

## 2019-11-29 DIAGNOSIS — F333 Major depressive disorder, recurrent, severe with psychotic symptoms: Secondary | ICD-10-CM | POA: Diagnosis not present

## 2019-12-06 DIAGNOSIS — F333 Major depressive disorder, recurrent, severe with psychotic symptoms: Secondary | ICD-10-CM | POA: Diagnosis not present

## 2019-12-13 ENCOUNTER — Other Ambulatory Visit (HOSPITAL_COMMUNITY)
Admission: RE | Admit: 2019-12-13 | Discharge: 2019-12-13 | Disposition: A | Discharge status: Home / Self Care | Payer: BC Managed Care – PPO | Source: Ambulatory Visit | Attending: Obstetrics & Gynecology | Admitting: Obstetrics & Gynecology

## 2019-12-13 ENCOUNTER — Encounter: Payer: Self-pay | Admitting: Obstetrics & Gynecology

## 2019-12-13 ENCOUNTER — Other Ambulatory Visit: Payer: Self-pay

## 2019-12-13 ENCOUNTER — Ambulatory Visit (INDEPENDENT_AMBULATORY_CARE_PROVIDER_SITE_OTHER): Payer: BC Managed Care – PPO | Admitting: Obstetrics & Gynecology

## 2019-12-13 VITALS — BP 111/73 | HR 114 | Wt 247.4 lb

## 2019-12-13 DIAGNOSIS — F333 Major depressive disorder, recurrent, severe with psychotic symptoms: Secondary | ICD-10-CM | POA: Diagnosis not present

## 2019-12-13 DIAGNOSIS — R61 Generalized hyperhidrosis: Secondary | ICD-10-CM | POA: Diagnosis not present

## 2019-12-13 DIAGNOSIS — Z8742 Personal history of other diseases of the female genital tract: Secondary | ICD-10-CM

## 2019-12-13 DIAGNOSIS — E282 Polycystic ovarian syndrome: Secondary | ICD-10-CM

## 2019-12-13 DIAGNOSIS — Z01419 Encounter for gynecological examination (general) (routine) without abnormal findings: Secondary | ICD-10-CM | POA: Diagnosis not present

## 2019-12-13 DIAGNOSIS — Z975 Presence of (intrauterine) contraceptive device: Secondary | ICD-10-CM | POA: Diagnosis not present

## 2019-12-13 MED ORDER — GABAPENTIN 600 MG PO TABS: 600.0000 mg | ORAL_TABLET | Freq: Every day | ORAL | 3 refills | Status: DC

## 2019-12-13 NOTE — Patient Instructions (Signed)
Preventive Care 21-39 Years Old, Female Preventive care refers to visits with your health care provider and lifestyle choices that can promote health and wellness. This includes:  A yearly physical exam. This may also be called an annual well check.  Regular dental visits and eye exams.  Immunizations.  Screening for certain conditions.  Healthy lifestyle choices, such as eating a healthy diet, getting regular exercise, not using drugs or products that contain nicotine and tobacco, and limiting alcohol use. What can I expect for my preventive care visit? Physical exam Your health care provider will check your:  Height and weight. This may be used to calculate body mass index (BMI), which tells if you are at a healthy weight.  Heart rate and blood pressure.  Skin for abnormal spots. Counseling Your health care provider may ask you questions about your:  Alcohol, tobacco, and drug use.  Emotional well-being.  Home and relationship well-being.  Sexual activity.  Eating habits.  Work and work environment.  Method of birth control.  Menstrual cycle.  Pregnancy history. What immunizations do I need?  Influenza (flu) vaccine  This is recommended every year. Tetanus, diphtheria, and pertussis (Tdap) vaccine  You may need a Td booster every 10 years. Varicella (chickenpox) vaccine  You may need this if you have not been vaccinated. Human papillomavirus (HPV) vaccine  If recommended by your health care provider, you may need three doses over 6 months. Measles, mumps, and rubella (MMR) vaccine  You may need at least one dose of MMR. You may also need a second dose. Meningococcal conjugate (MenACWY) vaccine  One dose is recommended if you are age 19-21 years and a first-year college student living in a residence hall, or if you have one of several medical conditions. You may also need additional booster doses. Pneumococcal conjugate (PCV13) vaccine  You may need  this if you have certain conditions and were not previously vaccinated. Pneumococcal polysaccharide (PPSV23) vaccine  You may need one or two doses if you smoke cigarettes or if you have certain conditions. Hepatitis A vaccine  You may need this if you have certain conditions or if you travel or work in places where you may be exposed to hepatitis A. Hepatitis B vaccine  You may need this if you have certain conditions or if you travel or work in places where you may be exposed to hepatitis B. Haemophilus influenzae type b (Hib) vaccine  You may need this if you have certain conditions. You may receive vaccines as individual doses or as more than one vaccine together in one shot (combination vaccines). Talk with your health care provider about the risks and benefits of combination vaccines. What tests do I need?  Blood tests  Lipid and cholesterol levels. These may be checked every 5 years starting at age 20.  Hepatitis C test.  Hepatitis B test. Screening  Diabetes screening. This is done by checking your blood sugar (glucose) after you have not eaten for a while (fasting).  Sexually transmitted disease (STD) testing.  BRCA-related cancer screening. This may be done if you have a family history of breast, ovarian, tubal, or peritoneal cancers.  Pelvic exam and Pap test. This may be done every 3 years starting at age 21. Starting at age 30, this may be done every 5 years if you have a Pap test in combination with an HPV test. Talk with your health care provider about your test results, treatment options, and if necessary, the need for more tests.   Follow these instructions at home: Eating and drinking   Eat a diet that includes fresh fruits and vegetables, whole grains, lean protein, and low-fat dairy.  Take vitamin and mineral supplements as recommended by your health care provider.  Do not drink alcohol if: ? Your health care provider tells you not to drink. ? You are  pregnant, may be pregnant, or are planning to become pregnant.  If you drink alcohol: ? Limit how much you have to 0-1 drink a day. ? Be aware of how much alcohol is in your drink. In the [place] one drink equals one 12 oz bottle of beer (355 mL), one 5 oz glass of wine (148 mL), or one 1 oz glass of hard liquor (44 mL). Lifestyle  Take daily care of your teeth and gums.  Stay active. Exercise for at least 30 minutes on 5 or more days each week.  Do not use any products that contain nicotine or tobacco, such as cigarettes, e-cigarettes, and chewing tobacco. If you need help quitting, ask your health care provider.  If you are sexually active, practice safe sex. Use a condom or other form of birth control (contraception) in order to prevent pregnancy and STIs (sexually transmitted infections). If you plan to become pregnant, see your health care provider for a preconception visit. What's next?  Visit your health care provider once a year for a well check visit.  Ask your health care provider how often you should have your eyes and teeth checked.  Stay up to date on all vaccines. This information is not intended to replace advice given to you by your health care provider. Make sure you discuss any questions you have with your health care provider. Document Revised: 02/25/2018 Document Reviewed: 02/25/2018 Elsevier Patient Education  2020 [employer].

## 2019-12-13 NOTE — Progress Notes (Signed)
GYNECOLOGY ANNUAL PREVENTATIVE CARE ENCOUNTER NOTE  History:     Melida Northington is a 39 y.o. [id number] female here for a routine annual gynecologic exam.  Current complaints: sweating profusely at night, hair loss, facial hair, nipple hair, belly button hair, brain fog, low libido, irritability and headaches. Also followed by Psychiatry, sees provider every 4-6 weeks. Has been tried on a myriad of mood stabilizers.   Denies abnormal vaginal bleeding, discharge, pelvic pain, problems with intercourse or other gynecologic concerns.    Gynecologic History No LMP recorded. (Menstrual status: IUD). Contraception: IUD Last Pap: 07/26/2015. Results were: normal with negative HPV  Obstetric History OB History  Gravida Para Term Preterm AB Living  _0 SAB TAB Ectopic Multiple Live Births          1    # Outcome Date GA Lbr Len/2nd Weight Sex Delivery Anes PTL Lv  1 Term 09/25/09 [redacted]w[redacted]d 8 lb 9 oz (3.884 kg) M    LIV    Past Medical History:  Diagnosis Date  . Anxiety   . Headache   . Kidney infection   . Major depression   . PCOS (polycystic ovarian syndrome)     Past Surgical History:  Procedure Laterality Date  . WISDOM TOOTH EXTRACTION  08/2015    Current Outpatient Medications on File Prior to Visit  Medication Sig Dispense Refill  . Cholecalciferol (VITAMIN D3) 50000 units CAPS 50,000 Units every 7 (seven) days.  11  . clonazePAM (KLONOPIN) 1 MG tablet Take 1 mg by mouth 3 (three) times daily.    .Marland KitchenlamoTRIgine (LAMICTAL) 200 MG tablet Take 400 mg by mouth daily.    .Marland Kitchenlevonorgestrel (MIRENA) 20 MCG/24HR IUD 1 each by Intrauterine route continuous.    . methylphenidate (RITALIN) 20 MG tablet TAKE 1 TABLET BY MOUTH EVERY MORNING, 1 TABLET AT NOON, AND 1 TABLET AT 4PM  0  . ondansetron (ZOFRAN) 4 MG tablet Take 1 tablet (4 mg total) by mouth every 8 (eight) hours as needed for nausea or vomiting. 30 tablet 6  . QUEtiapine (SEROQUEL XR) 400 MG 24 hr tablet Take 400 mg by  mouth at bedtime.    . SUMAtriptan (IMITREX) 6 MG/0.5ML SOLN injection Take one dose at headache onset, can take additional dose 2hrs later if needed. No more then 2 injections in 24hrs 6 mL 11   No current facility-administered medications on file prior to visit.    Allergies  Allergen Reactions  . Lithium Nausea And Vomiting  . Asa [Aspirin] Other (See Comments)    Patient stated that she can not take any blood thinners    Social History:  reports that she quit smoking about 3 years ago. Her smoking use included cigarettes. She smoked 0.25 packs per day. She has never used smokeless tobacco. She reports that she does not drink alcohol and does not use drugs.  Family History  Problem Relation Age of Onset  . Heart disease Mother   . Cancer Mother        skin  . Alcohol abuse Mother   . Depression Mother   . Prostate cancer Father   . Throat cancer Father   . Cancer Father        skin  . Alcohol abuse Father   . Drug abuse Father   . Depression Father   . Dementia Father        lewey body  . Bipolar disorder Sister   .  Anxiety disorder Brother   . Kidney failure Maternal Grandmother   . Brain cancer Maternal Grandfather   . Lung cancer Maternal Grandfather   . Autism Son   . Anxiety disorder Paternal Grandmother     The following portions of the patient's history were reviewed and updated as appropriate: allergies, current medications, past family history, past medical history, past social history, past surgical history and problem list.  Review of Systems Pertinent items noted in HPI and remainder of comprehensive ROS otherwise negative.  Physical Exam:  BP 111/73   Pulse (!) 114   Wt 247 lb 6.4 oz (112.2 kg)   BMI 35.50 kg/m  CONSTITUTIONAL: Well-developed, well-nourished female in no acute distress.  HENT:  Normocephalic, atraumatic, External right and left ear normal. Oropharynx is clear and moist EYES: Conjunctivae and EOM are normal. Pupils are equal, round,  and reactive to light. No scleral icterus.  NECK: Normal range of motion, supple, no masses.  Normal thyroid.  SKIN: Skin is warm and dry. No rash noted. Not diaphoretic. No erythema. No pallor. MUSCULOSKELETAL: Normal range of motion. No tenderness.  No cyanosis, clubbing, or edema.  2+ distal pulses. NEUROLOGIC: Alert and oriented to person, place, and time. Normal reflexes, muscle tone coordination.  PSYCHIATRIC: Normal mood and affect. Normal behavior. Normal judgment and thought content. CARDIOVASCULAR: Normal heart rate noted, regular rhythm RESPIRATORY: Clear to auscultation bilaterally. Effort and breath sounds normal, no problems with respiration noted. BREASTS: Symmetric in size. No masses, tenderness, skin changes, nipple drainage, or lymphadenopathy bilaterally. Performed in the presence of a chaperone. ABDOMEN: Soft, no distention noted.  No tenderness, rebound or guarding.  PELVIC: Normal appearing external genitalia and urethral meatus; normal appearing vaginal mucosa and cervix.  No abnormal discharge noted.  Pap smear obtained. IUD strings seen.  Normal uterine size, no other palpable masses, no uterine or adnexal tenderness.  Performed in the presence of a chaperone.   Assessment and Plan:      1. History of PCOS Discussed association of this with hirsutism. Discussed management options, patient declined any OCPs/Spironolactone or other medication for now. Will do cosmetic treatment.  2. Liletta IUD (intrauterine device) in place No issues, can keep in place until 2025.   3. Night sweats Discussed management options, will try Neurontin.   - gabapentin (NEURONTIN) 600 MG tablet; Take 1 tablet (600 mg total) by mouth at bedtime.  Dispense: 30 tablet; Refill: 3  4. Well woman exam with routine gynecological exam - Cytology - PAP( [place]) Will follow up results of pap smear and manage accordingly. Other issues discussed. Routine preventative health maintenance measures  emphasized. Please refer to After Visit Summary for other counseling recommendations.      Verita Schneiders, MD, [place] for [facility], [place]

## 2019-12-16 LAB — CYTOLOGY - PAP
Comment: NEGATIVE
Diagnosis: NEGATIVE
High risk HPV: NEGATIVE

## 2019-12-17 DIAGNOSIS — F410 Panic disorder [episodic paroxysmal anxiety] without agoraphobia: Secondary | ICD-10-CM | POA: Diagnosis not present

## 2019-12-17 DIAGNOSIS — F3131 Bipolar disorder, current episode depressed, mild: Secondary | ICD-10-CM | POA: Diagnosis not present

## 2019-12-17 DIAGNOSIS — F900 Attention-deficit hyperactivity disorder, predominantly inattentive type: Secondary | ICD-10-CM | POA: Diagnosis not present

## 2019-12-17 DIAGNOSIS — F3111 Bipolar disorder, current episode manic without psychotic features, mild: Secondary | ICD-10-CM | POA: Diagnosis not present

## 2019-12-22 DIAGNOSIS — F333 Major depressive disorder, recurrent, severe with psychotic symptoms: Secondary | ICD-10-CM | POA: Diagnosis not present

## 2020-01-03 DIAGNOSIS — F333 Major depressive disorder, recurrent, severe with psychotic symptoms: Secondary | ICD-10-CM | POA: Diagnosis not present

## 2020-01-04 NOTE — Telephone Encounter (Signed)
Error

## 2020-01-10 DIAGNOSIS — F333 Major depressive disorder, recurrent, severe with psychotic symptoms: Secondary | ICD-10-CM | POA: Diagnosis not present

## 2020-01-17 ENCOUNTER — Telehealth: Payer: Self-pay | Admitting: Neurology

## 2020-01-17 DIAGNOSIS — F333 Major depressive disorder, recurrent, severe with psychotic symptoms: Secondary | ICD-10-CM | POA: Diagnosis not present

## 2020-01-17 NOTE — Telephone Encounter (Signed)
Myriam Jacobson - I scheduled this Pt for tomorrow with Ward Givens. Just FYI

## 2020-01-18 ENCOUNTER — Encounter: Payer: Self-pay | Admitting: Adult Health

## 2020-01-18 ENCOUNTER — Ambulatory Visit: Payer: BC Managed Care – PPO | Admitting: Adult Health

## 2020-01-18 VITALS — BP 129/85 | HR 101 | Ht 70.0 in | Wt 248.0 lb

## 2020-01-18 DIAGNOSIS — G43711 Chronic migraine without aura, intractable, with status migrainosus: Secondary | ICD-10-CM | POA: Diagnosis not present

## 2020-01-18 DIAGNOSIS — F333 Major depressive disorder, recurrent, severe with psychotic symptoms: Secondary | ICD-10-CM | POA: Diagnosis not present

## 2020-01-18 MED ORDER — NURTEC 75 MG PO TBDP: ORAL_TABLET | ORAL | 5 refills | Status: DC

## 2020-01-18 MED ORDER — EMGALITY 120 MG/ML ~~LOC~~ SOAJ: 120.0000 mg | SUBCUTANEOUS | 5 refills | Status: DC

## 2020-01-18 NOTE — Patient Instructions (Signed)
Stop Aimovig Start Emgality: take at the next dose instead of Aimovig Try Nurtec: take at the onset of migraine. Only 1 tablet 24 hours If your symptoms worsen or you develop new symptoms please let us know.

## 2020-01-18 NOTE — Progress Notes (Addendum)
PATIENT: Heather Gardner DOB: Sep 06, 1980  REASON FOR VISIT: follow up HISTORY FROM: patient  HISTORY OF PRESENT ILLNESS: Today 01/18/20:  [name] is a 39 year old female with a history of migraine headaches.  She returns today for follow-up.  She remains on Aimovig and sumatriptan injections.  She states that her migraine headaches have increased.  Reports that she has a goal headache on a daily basis.  The pain is typically in the center of the forehead or the base of the neck.  Reports that recently she has had approximately 5 severe headaches a week.  She does report photophobia and phonophobia with her headaches.  Reports that movement exacerbates her discomfort.  Denies any nausea and vomiting.  States that typically she has to lay down in a dark room.  This discomfort makes her very irritable.  Sumatriptan injections are helpful.  She states that oral sumatriptan is not beneficial and does make her muscles hurt.  She has been on several triptan medications in the past including Maxalt and zomig.  She is also tried Botox in the past.  She returns today for an evaluation.  HISTORY Interval history: There was an insurance issue and she could not get her Aimovig ad the beginning of August she had the most severe migraines, severe vomiting, she had an infusion and she was better, she did get the Aimovig but not helping. Migraines got worse with Lithium and she was taken off of the Lithium. She stopped the Lithium last night. She restarted Aimovig and we need to wait, she  was shaking, her son was scared. Father recently diagnosed with ewy Body Dementia and has been stressful. She has since restarted Aimovig. Sumatriptan injections work. The pill works as well. Will refill all meds.  Interval history 07/2017; Here for follow up of migraines. Doing exceptionally well. Having 26 headache free days in a month.  However she gets more migraines at the end of the month before her next Aimovig  injection.  We discussed increasing Aimovig, increase aimovig to 2x monthly may even stagger the doses to every 2 weeks.  Patient is to continue Botox as well.  We discussed postponing Botox to see what the contribution is from both treatments and her migraine management.  The migraine she does have she wakes up with.  She has had a sleep test in the past and she does not have sleep apnea.  Migraines can occur in the early mornings.  However when migraines occur on waking, it is important to take a fast acting medication such as sumatriptan injections.  Taking a pill when you wake up with migraines is difficult because it takes a pill time to work.  Discussed other options such as sumatriptan powder, will call when sumatriptan injections.  She tried triptan's in the past, does not remember what happened or why she stopped taking them, denies any allergies or bad reactions to it.  Discussed the triptan's have the best evidence for acute management and this is likely what we should do.  HPI:  Heather Gardner is a 39 y.o. female here as a referral from Dr. Leta Baptist for intractable headaches.PMHx PCOS, depression. She has been having 26/30 days a month with headache for at least the last 6 months.16-17 a month are migrainous. Her migraines are more on the right side, pounding/throbbing, with nausea and vomiting, light sensitivity, motion worsens her migraines, going into a dark room and laying still really helps. They last 6-12 hours. She can wake with  headaches that last for days. She has nocturnal; headaches on the right side around the eye or at the base of her neck. The migraines can be severe 10/10 pain. They have been so painful her husband thought she was having a stroke. On average 6/10 but often severe. No aura. No medication overuse or OTC medication use. Triptans did not work at all.   REVIEW OF SYSTEMS: Out of a complete 14 system review of symptoms, the patient complains only of the following  symptoms, and all other reviewed systems are negative.  See HPI  ALLERGIES: Allergies  Allergen Reactions  . Lithium Nausea And Vomiting  . Asa [Aspirin] Other (See Comments)    Patient stated that she can not take any blood thinners    HOME MEDICATIONS: Outpatient Medications Prior to Visit  Medication Sig Dispense Refill  . Cholecalciferol (VITAMIN D3) 50000 units CAPS 50,000 Units every 7 (seven) days.  11  . clonazePAM (KLONOPIN) 1 MG tablet Take 1 mg by mouth 3 (three) times daily.    Marland Kitchen gabapentin (NEURONTIN) 600 MG tablet Take 1 tablet (600 mg total) by mouth at bedtime. 30 tablet 3  . lamoTRIgine (LAMICTAL) 200 MG tablet Take 400 mg by mouth daily.    Marland Kitchen levonorgestrel (MIRENA) 20 MCG/24HR IUD 1 each by Intrauterine route continuous.    . methylphenidate (RITALIN) 20 MG tablet TAKE 1 TABLET BY MOUTH EVERY MORNING, 1 TABLET AT NOON, AND 1 TABLET AT 4PM  0  . ondansetron (ZOFRAN) 4 MG tablet Take 1 tablet (4 mg total) by mouth every 8 (eight) hours as needed for nausea or vomiting. 30 tablet 6  . QUEtiapine (SEROQUEL XR) 400 MG 24 hr tablet Take 400 mg by mouth at bedtime.    . SUMAtriptan (IMITREX) 6 MG/0.5ML SOLN injection Take one dose at headache onset, can take additional dose 2hrs later if needed. No more then 2 injections in 24hrs 6 mL 11   No facility-administered medications prior to visit.    PAST MEDICAL HISTORY: Past Medical History:  Diagnosis Date  . Anxiety   . Headache   . Kidney infection   . Major depression   . PCOS (polycystic ovarian syndrome)     PAST SURGICAL HISTORY: Past Surgical History:  Procedure Laterality Date  . WISDOM TOOTH EXTRACTION  08/2015    FAMILY HISTORY: Family History  Problem Relation Age of Onset  . Heart disease Mother   . Cancer Mother        skin  . Alcohol abuse Mother   . Depression Mother   . Prostate cancer Father   . Throat cancer Father   . Cancer Father        skin  . Alcohol abuse Father   . Drug abuse  Father   . Depression Father   . Dementia Father        lewey body  . Bipolar disorder Sister   . Anxiety disorder Brother   . Kidney failure Maternal Grandmother   . Brain cancer Maternal Grandfather   . Lung cancer Maternal Grandfather   . Autism Son   . Anxiety disorder Paternal Grandmother     SOCIAL HISTORY: Social History   Socioeconomic History  . Marital status: Married    Spouse name: Not on file  . Number of children: 1  . Years of education: Not on file  . Highest education level: Master's degree (e.g., MA, MS, MEng, MEd, MSW, MBA)  Occupational History  . Occupation: [employer]  Tobacco Use  . Smoking status: Former Smoker    Packs/day: 0.25    Types: Cigarettes    Quit date: 06/30/2016    Years since quitting: 3.5  . Smokeless tobacco: Never Used  . Tobacco comment: 08/22/15 smoke when stressed  Vaping Use  . Vaping Use: Never used  Substance and Sexual Activity  . Alcohol use: No    Alcohol/week: 0.0 standard drinks  . Drug use: No  . Sexual activity: Yes    Birth control/protection: I.U.D.  Other Topics Concern  . Not on file  Social History Narrative   Lives at home w/ her husband and son   Right-handed   Caffeine: rare   Social Determinants of Health   Financial Resource Strain:   . Difficulty of Paying Living Expenses:   Food Insecurity:   . Worried About Charity fundraiser in the Last Year:   . Arboriculturist in the Last Year:   Transportation Needs:   . Film/video editor (Medical):   Marland Kitchen Lack of Transportation (Non-Medical):   Physical Activity:   . Days of Exercise per Week:   . Minutes of Exercise per Session:   Stress:   . Feeling of Stress :   Social Connections:   . Frequency of Communication with Friends and Family:   . Frequency of Social Gatherings with Friends and Family:   . Attends Religious Services:   . Active Member of Clubs or Organizations:   . Attends Archivist Meetings:   Marland Kitchen Marital  Status:   Intimate Partner Violence:   . Fear of Current or Ex-Partner:   . Emotionally Abused:   Marland Kitchen Physically Abused:   . Sexually Abused:       PHYSICAL EXAM  Vitals:   01/18/20 1338  BP: 129/85  Pulse: (!) 101  Weight: 248 lb (112.5 kg)  Height: 5\' 10"  (1.778 m)   Body mass index is 35.58 kg/m.  Generalized: Well developed, in no acute distress   Neurological examination  Mentation: Alert oriented to time, place, history taking. Follows all commands speech and language fluent Cranial nerve II-XII: Pupils were equal round reactive to light. Extraocular movements were full, visual field were full on confrontational test. Head turning and shoulder shrug  were normal and symmetric. Motor: The motor testing reveals 5 over 5 strength of all 4 extremities. Good symmetric motor tone is noted throughout.  Sensory: Sensory testing is intact to soft touch on all 4 extremities. No evidence of extinction is noted.  Coordination: Cerebellar testing reveals good finger-nose-finger and heel-to-shin bilaterally.  Gait and station: Gait is normal.  Reflexes: Deep tendon reflexes are symmetric and normal bilaterally.   DIAGNOSTIC DATA (LABS, IMAGING, TESTING) - I reviewed patient records, labs, notes, testing and imaging myself where available.  Lab Results  Component Value Date   WBC 6.9 05/10/2012   HGB 14.2 05/10/2012   HCT 41.1 05/10/2012   MCV 84.4 05/10/2012   PLT 223 05/10/2012      Component Value Date/Time   NA 135 05/10/2012 0230   K 3.4 (L) 05/10/2012 0230   CL 101 05/10/2012 0230   CO2 24 05/10/2012 0230   GLUCOSE 80 05/10/2012 0230   BUN 13 05/10/2012 0230   CREATININE 0.56 05/10/2012 0230   CALCIUM 9.3 05/10/2012 0230   PROT 7.4 05/10/2012 0230   ALBUMIN 3.9 05/10/2012 0230   AST 22 05/10/2012 0230   ALT 25 05/10/2012 0230   ALKPHOS 68 05/10/2012 0230  BILITOT 0.3 05/10/2012 0230   GFRNONAA >90 05/10/2012 0230   GFRAA >90 05/10/2012 0230       ASSESSMENT AND PLAN 39 y.o. year old female  has a past medical history of Anxiety, Headache, Kidney infection, Major depression, and PCOS (polycystic ovarian syndrome). here with:  Migraine headaches   Stop Aimovig  Start Emgality: take at the next dose instead of Aimovig  Try Nurtec: take at the onset of migraine. Only 1 tablet 24 hours  Unable to tolerate oral triptans  Advised if headaches do not improve she should let us know  Follow-up in 6 months or sooner if needed    I spent 30 minutes of face-to-face and non-face-to-face time with patient.  This included previsit chart review, lab review, study review, order entry, electronic health record documentation, patient education.  Ward Givens, MSN, NP-C 01/18/2020, 1:36 PM [facility] [address] [place], [place] [address] [phone]  Made any corrections needed, and agree with history, physical, neuro exam,assessment and plan as stated.     Sarina Ill, MD [facility]

## 2020-01-19 ENCOUNTER — Telehealth: Payer: Self-pay | Admitting: *Deleted

## 2020-01-19 NOTE — Telephone Encounter (Signed)
Nurtec PA, key: [id number], [id number], failed: sumatriptan, maxalt, zomig, Botox, tylenol, ibuprofen (can't take). Your information has been submitted to [place]. [employer] [place] will review the request and notify you of the determination decision directly, typically within 72 hours of receiving all information. If [employer] [facility] has not responded within the specified timeframe or if you have any questions about your PA submission, contact [place] directly at [phone].

## 2020-01-23 ENCOUNTER — Telehealth: Payer: Self-pay | Admitting: Adult Health

## 2020-01-23 NOTE — Telephone Encounter (Signed)
[employer]: Nurtec denied, must try/fail Ubrelvy.

## 2020-01-23 NOTE — Telephone Encounter (Signed)
Received a PA request for Emgality 120mg /mL. PA was started on [email or web address]. Key is [id number]. PA was approved.

## 2020-01-23 NOTE — Telephone Encounter (Signed)
Yes, ask her to use the nurtec copay card thanks!

## 2020-01-23 NOTE — Telephone Encounter (Signed)
Sent my chart advising her to use Nurtec discount card form their web site.

## 2020-01-24 DIAGNOSIS — F333 Major depressive disorder, recurrent, severe with psychotic symptoms: Secondary | ICD-10-CM | POA: Diagnosis not present

## 2020-01-31 DIAGNOSIS — F333 Major depressive disorder, recurrent, severe with psychotic symptoms: Secondary | ICD-10-CM | POA: Diagnosis not present

## 2020-02-07 DIAGNOSIS — F333 Major depressive disorder, recurrent, severe with psychotic symptoms: Secondary | ICD-10-CM | POA: Diagnosis not present

## 2020-02-14 DIAGNOSIS — F333 Major depressive disorder, recurrent, severe with psychotic symptoms: Secondary | ICD-10-CM | POA: Diagnosis not present

## 2020-02-21 DIAGNOSIS — F333 Major depressive disorder, recurrent, severe with psychotic symptoms: Secondary | ICD-10-CM | POA: Diagnosis not present

## 2020-02-25 DIAGNOSIS — F410 Panic disorder [episodic paroxysmal anxiety] without agoraphobia: Secondary | ICD-10-CM | POA: Diagnosis not present

## 2020-02-25 DIAGNOSIS — F3132 Bipolar disorder, current episode depressed, moderate: Secondary | ICD-10-CM | POA: Diagnosis not present

## 2020-02-25 DIAGNOSIS — F900 Attention-deficit hyperactivity disorder, predominantly inattentive type: Secondary | ICD-10-CM | POA: Diagnosis not present

## 2020-02-28 DIAGNOSIS — F333 Major depressive disorder, recurrent, severe with psychotic symptoms: Secondary | ICD-10-CM | POA: Diagnosis not present

## 2020-03-06 DIAGNOSIS — F333 Major depressive disorder, recurrent, severe with psychotic symptoms: Secondary | ICD-10-CM | POA: Diagnosis not present

## 2020-03-13 DIAGNOSIS — F333 Major depressive disorder, recurrent, severe with psychotic symptoms: Secondary | ICD-10-CM | POA: Diagnosis not present

## 2020-03-16 ENCOUNTER — Encounter: Payer: Self-pay | Admitting: Adult Health

## 2020-03-20 DIAGNOSIS — F333 Major depressive disorder, recurrent, severe with psychotic symptoms: Secondary | ICD-10-CM | POA: Diagnosis not present

## 2020-03-27 DIAGNOSIS — F333 Major depressive disorder, recurrent, severe with psychotic symptoms: Secondary | ICD-10-CM | POA: Diagnosis not present

## 2020-03-29 DIAGNOSIS — F333 Major depressive disorder, recurrent, severe with psychotic symptoms: Secondary | ICD-10-CM | POA: Diagnosis not present

## 2020-03-31 DIAGNOSIS — Z720 Tobacco use: Secondary | ICD-10-CM | POA: Diagnosis not present

## 2020-04-03 DIAGNOSIS — F333 Major depressive disorder, recurrent, severe with psychotic symptoms: Secondary | ICD-10-CM | POA: Diagnosis not present

## 2020-04-10 DIAGNOSIS — F333 Major depressive disorder, recurrent, severe with psychotic symptoms: Secondary | ICD-10-CM | POA: Diagnosis not present

## 2020-04-17 DIAGNOSIS — F333 Major depressive disorder, recurrent, severe with psychotic symptoms: Secondary | ICD-10-CM | POA: Diagnosis not present

## 2020-04-21 DIAGNOSIS — F410 Panic disorder [episodic paroxysmal anxiety] without agoraphobia: Secondary | ICD-10-CM | POA: Diagnosis not present

## 2020-04-21 DIAGNOSIS — F3111 Bipolar disorder, current episode manic without psychotic features, mild: Secondary | ICD-10-CM | POA: Diagnosis not present

## 2020-04-21 DIAGNOSIS — F3131 Bipolar disorder, current episode depressed, mild: Secondary | ICD-10-CM | POA: Diagnosis not present

## 2020-04-21 DIAGNOSIS — F900 Attention-deficit hyperactivity disorder, predominantly inattentive type: Secondary | ICD-10-CM | POA: Diagnosis not present

## 2020-04-24 DIAGNOSIS — F333 Major depressive disorder, recurrent, severe with psychotic symptoms: Secondary | ICD-10-CM | POA: Diagnosis not present

## 2020-04-26 DIAGNOSIS — K921 Melena: Secondary | ICD-10-CM | POA: Diagnosis not present

## 2020-04-26 DIAGNOSIS — R198 Other specified symptoms and signs involving the digestive system and abdomen: Secondary | ICD-10-CM | POA: Diagnosis not present

## 2020-05-07 ENCOUNTER — Telehealth: Payer: Self-pay | Admitting: Adult Health

## 2020-05-07 NOTE — Telephone Encounter (Signed)
Received PA request for Emgality. PA was started on [email or web address]. Key is [id number]. Per [employer]MM[employer], a determination will be received within the next 72 hours. Will check back later for a determination.

## 2020-05-08 DIAGNOSIS — F333 Major depressive disorder, recurrent, severe with psychotic symptoms: Secondary | ICD-10-CM | POA: Diagnosis not present

## 2020-05-08 NOTE — Telephone Encounter (Signed)
Received response from [facility] stating PA was approved 05/07/20 to 05/06/21. Will fax a copy of the determination once it has been received.

## 2020-05-15 DIAGNOSIS — F333 Major depressive disorder, recurrent, severe with psychotic symptoms: Secondary | ICD-10-CM | POA: Diagnosis not present

## 2020-05-28 ENCOUNTER — Encounter: Payer: Self-pay | Admitting: Adult Health

## 2020-05-29 ENCOUNTER — Other Ambulatory Visit: Payer: Self-pay | Admitting: Neurology

## 2020-05-29 DIAGNOSIS — F333 Major depressive disorder, recurrent, severe with psychotic symptoms: Secondary | ICD-10-CM | POA: Diagnosis not present

## 2020-06-05 DIAGNOSIS — F333 Major depressive disorder, recurrent, severe with psychotic symptoms: Secondary | ICD-10-CM | POA: Diagnosis not present

## 2020-06-12 DIAGNOSIS — F333 Major depressive disorder, recurrent, severe with psychotic symptoms: Secondary | ICD-10-CM | POA: Diagnosis not present

## 2020-06-18 DIAGNOSIS — Z1159 Encounter for screening for other viral diseases: Secondary | ICD-10-CM | POA: Diagnosis not present

## 2020-06-19 DIAGNOSIS — F333 Major depressive disorder, recurrent, severe with psychotic symptoms: Secondary | ICD-10-CM | POA: Diagnosis not present

## 2020-06-21 DIAGNOSIS — K921 Melena: Secondary | ICD-10-CM | POA: Diagnosis not present

## 2020-06-21 DIAGNOSIS — Z8371 Family history of colonic polyps: Secondary | ICD-10-CM | POA: Diagnosis not present

## 2020-07-03 DIAGNOSIS — F333 Major depressive disorder, recurrent, severe with psychotic symptoms: Secondary | ICD-10-CM | POA: Diagnosis not present

## 2020-07-10 DIAGNOSIS — F333 Major depressive disorder, recurrent, severe with psychotic symptoms: Secondary | ICD-10-CM | POA: Diagnosis not present

## 2020-07-19 DIAGNOSIS — F333 Major depressive disorder, recurrent, severe with psychotic symptoms: Secondary | ICD-10-CM | POA: Diagnosis not present

## 2020-07-23 DIAGNOSIS — F333 Major depressive disorder, recurrent, severe with psychotic symptoms: Secondary | ICD-10-CM | POA: Diagnosis not present

## 2020-07-24 ENCOUNTER — Encounter: Payer: Self-pay | Admitting: Adult Health

## 2020-07-24 ENCOUNTER — Ambulatory Visit: Payer: BC Managed Care – PPO | Admitting: Adult Health

## 2020-07-24 VITALS — BP 117/79 | HR 83 | Ht 70.0 in | Wt 248.0 lb

## 2020-07-24 DIAGNOSIS — G43711 Chronic migraine without aura, intractable, with status migrainosus: Secondary | ICD-10-CM | POA: Diagnosis not present

## 2020-07-24 NOTE — Patient Instructions (Addendum)
Your Plan:  Continue Emgality and Nurtec Referral to Headache clinic If your symptoms worsen or you develop new symptoms please let us know.   Thank you for coming to see [place] at [facility]. I hope we have been able to provide you high quality care today.  You may receive a patient satisfaction survey over the next few weeks. We would appreciate your feedback and comments so that we may continue to improve ourselves and the health of our patients.

## 2020-07-24 NOTE — Progress Notes (Addendum)
PATIENT: Heather Gardner DOB: 1981-02-18  REASON FOR VISIT: follow up HISTORY FROM: patient  HISTORY OF PRESENT ILLNESS: Today 07/24/20: Heather Gardner is a 40 year old female with a history of migraine headaches.  She returns today for follow-up.  She states that she has a dull headache most days.  2-3 times a week she has a migraine with nausea, photophobia, phonophobia.  She states typically she can take Nurtec and her headache will resolve within 30 minutes.  She does not feel that the Emgality has decreased her headache frequency.  She is currently using a co-pay card for [employer] and Nurtec.  She states some months the co-pay card is not working and may take an extra 10 days to get her medication.  She has tried and failed multiple medications in the past.  Has had a sleep evaluation that did not reveal sleep apnea.  MRI of the brain October 27, 2015:  IMPRESSION:  This MRI of the brain with and without contrast shows the following: 1.    There are no acute findings. 2.    A right frontal draining vein is mildly enlarged though there are no significant developmental venous anomalies.   this is unlikely to be clinically significant.  Medications tried: TPX, Indomethacin, Neurontin, Maxalt, Lamictal, buspar, lexapro, metoprolol, zofran, imitre nasal spray, zomig, Aimovig, Emgality  01/18/20: Heather Gardner is a 39 year old female with a history of migraine headaches.  She returns today for follow-up.  She remains on Aimovig and sumatriptan injections.  She states that her migraine headaches have increased.  Reports that she has a goal headache on a daily basis.  The pain is typically in the center of the forehead or the base of the neck.  Reports that recently she has had approximately 5 severe headaches a week.  She does report photophobia and phonophobia with her headaches.  Reports that movement exacerbates her discomfort.  Denies any nausea and vomiting.  States that typically she has to lay  down in a dark room.  This discomfort makes her very irritable.  Sumatriptan injections are helpful.  She states that oral sumatriptan is not beneficial and does make her muscles hurt.  She has been on several triptan medications in the past including Maxalt and zomig.  She is also tried Botox in the past.  She returns today for an evaluation.  HISTORY Interval history: There was an insurance issue and she could not get her Aimovig ad the beginning of August she had the most severe migraines, severe vomiting, she had an infusion and she was better, she did get the Aimovig but not helping. Migraines got worse with Lithium and she was taken off of the Lithium. She stopped the Lithium last night. She restarted Aimovig and we need to wait, she  was shaking, her son was scared. Father recently diagnosed with ewy Body Dementia and has been stressful. She has since restarted Aimovig. Sumatriptan injections work. The pill works as well. Will refill all meds.  Interval history 07/2017; Here for follow up of migraines. Doing exceptionally well. Having 26 headache free days in a month.  However she gets more migraines at the end of the month before her next Aimovig injection.  We discussed increasing Aimovig, increase aimovig to 2x monthly may even stagger the doses to every 2 weeks.  Patient is to continue Botox as well.  We discussed postponing Botox to see what the contribution is from both treatments and her migraine management.  The migraine she does  have she wakes up with.  She has had a sleep test in the past and she does not have sleep apnea.  Migraines can occur in the early mornings.  However when migraines occur on waking, it is important to take a fast acting medication such as sumatriptan injections.  Taking a pill when you wake up with migraines is difficult because it takes a pill time to work.  Discussed other options such as sumatriptan powder, will call when sumatriptan injections.  She tried triptan's in  the past, does not remember what happened or why she stopped taking them, denies any allergies or bad reactions to it.  Discussed the triptan's have the best evidence for acute management and this is likely what we should do.  HPI:  Heather Gardner is a 40 y.o. female here as a referral from Dr. Leta Baptist for intractable headaches.PMHx PCOS, depression. She has been having 26/30 days a month with headache for at least the last 6 months.16-17 a month are migrainous. Her migraines are more on the right side, pounding/throbbing, with nausea and vomiting, light sensitivity, motion worsens her migraines, going into a dark room and laying still really helps. They last 6-12 hours. She can wake with headaches that last for days. She has nocturnal; headaches on the right side around the eye or at the base of her neck. The migraines can be severe 10/10 pain. They have been so painful her husband thought she was having a stroke. On average 6/10 but often severe. No aura. No medication overuse or OTC medication use. Triptans did not work at all.   REVIEW OF SYSTEMS: Out of a complete 14 system review of symptoms, the patient complains only of the following symptoms, and all other reviewed systems are negative.  See HPI  ALLERGIES: Allergies  Allergen Reactions  . Lithium Nausea And Vomiting  . Asa [Aspirin] Other (See Comments)    Patient stated that she can not take any blood thinners    HOME MEDICATIONS: Outpatient Medications Prior to Visit  Medication Sig Dispense Refill  . cariprazine (VRAYLAR) capsule 1 capsule    . clonazePAM (KLONOPIN) 1 MG tablet Take by mouth 3 (three) times daily. Takes 2mg   at bedtime and 1 mg daily as needed.    . Galcanezumab-gnlm (EMGALITY) 120 MG/ML SOAJ Inject 120 mg into the skin every 30 (thirty) days. 1 mL 5  . lamoTRIgine (LAMICTAL) 200 MG tablet Take 400 mg by mouth daily.    Marland Kitchen levonorgestrel (MIRENA) 20 MCG/24HR IUD 1 each by Intrauterine route continuous.    .  methylphenidate (RITALIN) 20 MG tablet TAKE 1 TABLET BY MOUTH EVERY MORNING, 1 TABLET AT NOON, AND 1 TABLET AT 4PM  0  . ondansetron (ZOFRAN) 4 MG tablet Take 1 tablet (4 mg total) by mouth every 8 (eight) hours as needed for nausea or vomiting. 20 tablet 0  . QUEtiapine (SEROQUEL XR) 300 MG 24 hr tablet Take 300 mg by mouth at bedtime.    . Rimegepant Sulfate (NURTEC) 75 MG TBDP Take 1 tablet at the onset of migraine. Only 1 tablet in 24 hours 15 tablet 5  . SUMAtriptan (IMITREX) 6 MG/0.5ML SOLN injection Take one dose at headache onset, can take additional dose 2hrs later if needed. No more then 2 injections in 24hrs 6 mL 11  . Cholecalciferol (VITAMIN D3) 50000 units CAPS 50,000 Units every 7 (seven) days.  11  . gabapentin (NEURONTIN) 600 MG tablet Take 1 tablet (600 mg total) by mouth at  bedtime. 30 tablet 3   No facility-administered medications prior to visit.    PAST MEDICAL HISTORY: Past Medical History:  Diagnosis Date  . Anxiety   . Headache   . Kidney infection   . Major depression   . PCOS (polycystic ovarian syndrome)     PAST SURGICAL HISTORY: Past Surgical History:  Procedure Laterality Date  . WISDOM TOOTH EXTRACTION  08/2015    FAMILY HISTORY: Family History  Problem Relation Age of Onset  . Heart disease Mother   . Cancer Mother        skin  . Alcohol abuse Mother   . Depression Mother   . Prostate cancer Father   . Throat cancer Father   . Cancer Father        skin  . Alcohol abuse Father   . Drug abuse Father   . Depression Father   . Dementia Father        lewey body  . Bipolar disorder Sister   . Anxiety disorder Brother   . Kidney failure Maternal Grandmother   . Brain cancer Maternal Grandfather   . Lung cancer Maternal Grandfather   . Autism Son   . Anxiety disorder Paternal Grandmother     SOCIAL HISTORY: Social History   Socioeconomic History  . Marital status: Married    Spouse name: Not on file  . Number of children: 1  . Years  of education: Not on file  . Highest education level: Master's degree (e.g., MA, MS, MEng, MEd, MSW, MBA)  Occupational History  . Occupation: [employer]  Tobacco Use  . Smoking status: Former Smoker    Packs/day: 0.25    Types: Cigarettes    Quit date: 06/30/2016    Years since quitting: 4.0  . Smokeless tobacco: Never Used  . Tobacco comment: 08/22/15 smoke when stressed  Vaping Use  . Vaping Use: Never used  Substance and Sexual Activity  . Alcohol use: No    Alcohol/week: 0.0 standard drinks  . Drug use: No  . Sexual activity: Yes    Birth control/protection: I.U.D.  Other Topics Concern  . Not on file  Social History Narrative   Lives at home w/ her husband and son   Right-handed   Caffeine: rare   Social Determinants of Health   Financial Resource Strain: Not on file  Food Insecurity: Not on file  Transportation Needs: Not on file  Physical Activity: Not on file  Stress: Not on file  Social Connections: Not on file  Intimate Partner Violence: Not on file      PHYSICAL EXAM  Vitals:   07/24/20 1408  BP: 117/79  Pulse: 83  Weight: 248 lb (112.5 kg)  Height: 5\' 10"  (1.778 m)   Body mass index is 35.58 kg/m.  Generalized: Well developed, in no acute distress   Neurological examination  Mentation: Alert oriented to time, place, history taking. Follows all commands speech and language fluent Cranial nerve II-XII: Pupils were equal round reactive to light. Extraocular movements were full, visual field were full on confrontational test. Head turning and shoulder shrug  were normal and symmetric. Motor: The motor testing reveals 5 over 5 strength of all 4 extremities. Good symmetric motor tone is noted throughout.  Sensory: Sensory testing is intact to soft touch on all 4 extremities. No evidence of extinction is noted.  Coordination: Cerebellar testing reveals good finger-nose-finger and heel-to-shin bilaterally.  Gait and station: Gait is normal.   Reflexes: Deep tendon reflexes  are symmetric and normal bilaterally.   DIAGNOSTIC DATA (LABS, IMAGING, TESTING) - I reviewed patient records, labs, notes, testing and imaging myself where available.  Lab Results  Component Value Date   WBC 6.9 05/10/2012   HGB 14.2 05/10/2012   HCT 41.1 05/10/2012   MCV 84.4 05/10/2012   PLT 223 05/10/2012      Component Value Date/Time   NA 135 05/10/2012 0230   K 3.4 (L) 05/10/2012 0230   CL 101 05/10/2012 0230   CO2 24 05/10/2012 0230   GLUCOSE 80 05/10/2012 0230   BUN 13 05/10/2012 0230   CREATININE 0.56 05/10/2012 0230   CALCIUM 9.3 05/10/2012 0230   PROT 7.4 05/10/2012 0230   ALBUMIN 3.9 05/10/2012 0230   AST 22 05/10/2012 0230   ALT 25 05/10/2012 0230   ALKPHOS 68 05/10/2012 0230   BILITOT 0.3 05/10/2012 0230   GFRNONAA >90 05/10/2012 0230   GFRAA >90 05/10/2012 0230      ASSESSMENT AND PLAN 40 y.o. year old female  has a past medical history of Anxiety, Headache, Kidney infection, Major depression, and PCOS (polycystic ovarian syndrome). here with:  Intractable migraine headaches  --Patient has tried and failed multiple medications. --She will be referred to a headache center for second opinion --For now she will remain on Emgality and Nurtec.  If she is unable to get Emgality from the pharmacy with her co-pay card she may switch back to [place].  Advised that she should call her insurance to see if Aimovig is on her formulary first. --Follow-up if needed    I spent 30 minutes of face-to-face and non-face-to-face time with patient.  This included previsit chart review, lab review, study review, order entry, electronic health record documentation, patient education.  Ward Givens, MSN, NP-C 07/24/2020, 2:13 PM [facility] [address], [place], [place] [address] [phone]   agree with assessment and plan as stated.     Sarina Ill, MD [facility]

## 2020-07-26 ENCOUNTER — Other Ambulatory Visit: Payer: Self-pay | Admitting: Adult Health

## 2020-07-26 MED ORDER — UBRELVY 100 MG PO TABS: ORAL_TABLET | ORAL | 11 refills | Status: DC

## 2020-07-30 ENCOUNTER — Encounter: Payer: Self-pay | Admitting: Adult Health

## 2020-07-30 ENCOUNTER — Telehealth: Payer: Self-pay | Admitting: *Deleted

## 2020-07-30 DIAGNOSIS — G43711 Chronic migraine without aura, intractable, with status migrainosus: Secondary | ICD-10-CM

## 2020-07-30 NOTE — Telephone Encounter (Signed)
See my chart message

## 2020-07-30 NOTE — Progress Notes (Signed)
Initiate CMM KEY for Ubrelvy.  (Key: [id number]).  Approved.  Tried rizatriptan, zomig, imitrex NS.  Effective from 07/30/2020 through 10/21/2020.

## 2020-07-31 MED ORDER — PREDNISONE 5 MG PO TABS: ORAL_TABLET | ORAL | 0 refills | Status: DC

## 2020-07-31 NOTE — Addendum Note (Signed)
Addended by: Trudie Buckler on: 07/31/2020 10:16 AM   Modules accepted: Orders

## 2020-08-01 DIAGNOSIS — F333 Major depressive disorder, recurrent, severe with psychotic symptoms: Secondary | ICD-10-CM | POA: Diagnosis not present

## 2020-08-03 ENCOUNTER — Encounter: Payer: Self-pay | Admitting: Adult Health

## 2020-08-14 DIAGNOSIS — F333 Major depressive disorder, recurrent, severe with psychotic symptoms: Secondary | ICD-10-CM | POA: Diagnosis not present

## 2020-08-21 DIAGNOSIS — F333 Major depressive disorder, recurrent, severe with psychotic symptoms: Secondary | ICD-10-CM | POA: Diagnosis not present

## 2020-08-28 DIAGNOSIS — F333 Major depressive disorder, recurrent, severe with psychotic symptoms: Secondary | ICD-10-CM | POA: Diagnosis not present

## 2020-08-30 DIAGNOSIS — R198 Other specified symptoms and signs involving the digestive system and abdomen: Secondary | ICD-10-CM | POA: Insufficient documentation

## 2020-08-30 DIAGNOSIS — G43711 Chronic migraine without aura, intractable, with status migrainosus: Secondary | ICD-10-CM | POA: Diagnosis not present

## 2020-08-30 DIAGNOSIS — E669 Obesity, unspecified: Secondary | ICD-10-CM | POA: Diagnosis not present

## 2020-08-30 DIAGNOSIS — Z79899 Other long term (current) drug therapy: Secondary | ICD-10-CM | POA: Diagnosis not present

## 2020-08-30 DIAGNOSIS — Z720 Tobacco use: Secondary | ICD-10-CM | POA: Insufficient documentation

## 2020-08-30 DIAGNOSIS — K5900 Constipation, unspecified: Secondary | ICD-10-CM | POA: Insufficient documentation

## 2020-08-30 DIAGNOSIS — F329 Major depressive disorder, single episode, unspecified: Secondary | ICD-10-CM | POA: Insufficient documentation

## 2020-08-30 DIAGNOSIS — K921 Melena: Secondary | ICD-10-CM | POA: Insufficient documentation

## 2020-08-30 DIAGNOSIS — D1802 Hemangioma of intracranial structures: Secondary | ICD-10-CM | POA: Diagnosis not present

## 2020-08-30 DIAGNOSIS — G4486 Cervicogenic headache: Secondary | ICD-10-CM | POA: Diagnosis not present

## 2020-09-04 DIAGNOSIS — F333 Major depressive disorder, recurrent, severe with psychotic symptoms: Secondary | ICD-10-CM | POA: Diagnosis not present

## 2020-09-07 DIAGNOSIS — G43711 Chronic migraine without aura, intractable, with status migrainosus: Secondary | ICD-10-CM | POA: Diagnosis not present

## 2020-09-11 DIAGNOSIS — F333 Major depressive disorder, recurrent, severe with psychotic symptoms: Secondary | ICD-10-CM | POA: Diagnosis not present

## 2020-09-13 DIAGNOSIS — F3174 Bipolar disorder, in full remission, most recent episode manic: Secondary | ICD-10-CM | POA: Diagnosis not present

## 2020-09-13 DIAGNOSIS — F4321 Adjustment disorder with depressed mood: Secondary | ICD-10-CM | POA: Diagnosis not present

## 2020-09-13 DIAGNOSIS — F410 Panic disorder [episodic paroxysmal anxiety] without agoraphobia: Secondary | ICD-10-CM | POA: Diagnosis not present

## 2020-09-13 DIAGNOSIS — F3176 Bipolar disorder, in full remission, most recent episode depressed: Secondary | ICD-10-CM | POA: Diagnosis not present

## 2020-09-14 DIAGNOSIS — G4486 Cervicogenic headache: Secondary | ICD-10-CM | POA: Diagnosis not present

## 2020-09-14 DIAGNOSIS — D1802 Hemangioma of intracranial structures: Secondary | ICD-10-CM | POA: Diagnosis not present

## 2020-09-14 DIAGNOSIS — G43711 Chronic migraine without aura, intractable, with status migrainosus: Secondary | ICD-10-CM | POA: Diagnosis not present

## 2020-09-18 DIAGNOSIS — F333 Major depressive disorder, recurrent, severe with psychotic symptoms: Secondary | ICD-10-CM | POA: Diagnosis not present

## 2020-09-18 DIAGNOSIS — G4486 Cervicogenic headache: Secondary | ICD-10-CM | POA: Diagnosis not present

## 2020-09-24 DIAGNOSIS — F333 Major depressive disorder, recurrent, severe with psychotic symptoms: Secondary | ICD-10-CM | POA: Diagnosis not present

## 2020-10-02 DIAGNOSIS — F333 Major depressive disorder, recurrent, severe with psychotic symptoms: Secondary | ICD-10-CM | POA: Diagnosis not present

## 2020-10-09 DIAGNOSIS — F333 Major depressive disorder, recurrent, severe with psychotic symptoms: Secondary | ICD-10-CM | POA: Diagnosis not present

## 2020-10-10 DIAGNOSIS — G43711 Chronic migraine without aura, intractable, with status migrainosus: Secondary | ICD-10-CM | POA: Diagnosis not present

## 2020-10-10 DIAGNOSIS — G4486 Cervicogenic headache: Secondary | ICD-10-CM | POA: Diagnosis not present

## 2020-10-16 DIAGNOSIS — F333 Major depressive disorder, recurrent, severe with psychotic symptoms: Secondary | ICD-10-CM | POA: Diagnosis not present

## 2020-10-19 DIAGNOSIS — G4486 Cervicogenic headache: Secondary | ICD-10-CM | POA: Diagnosis not present

## 2020-10-19 DIAGNOSIS — R836 Abnormal cytological findings in cerebrospinal fluid: Secondary | ICD-10-CM | POA: Diagnosis not present

## 2020-10-23 DIAGNOSIS — F333 Major depressive disorder, recurrent, severe with psychotic symptoms: Secondary | ICD-10-CM | POA: Diagnosis not present

## 2020-10-29 DIAGNOSIS — G43711 Chronic migraine without aura, intractable, with status migrainosus: Secondary | ICD-10-CM | POA: Diagnosis not present

## 2020-10-29 DIAGNOSIS — G4486 Cervicogenic headache: Secondary | ICD-10-CM | POA: Diagnosis not present

## 2020-10-30 DIAGNOSIS — F333 Major depressive disorder, recurrent, severe with psychotic symptoms: Secondary | ICD-10-CM | POA: Diagnosis not present

## 2020-11-01 ENCOUNTER — Telehealth: Payer: Self-pay | Admitting: *Deleted

## 2020-11-01 NOTE — Telephone Encounter (Signed)
We received a Roselyn Meier PA from [facility] however I have reviewed the chart and see that patient is currently seeing [facility] and they initiated a PA on 10/31/20.

## 2020-11-06 DIAGNOSIS — F333 Major depressive disorder, recurrent, severe with psychotic symptoms: Secondary | ICD-10-CM | POA: Diagnosis not present

## 2020-11-20 DIAGNOSIS — F333 Major depressive disorder, recurrent, severe with psychotic symptoms: Secondary | ICD-10-CM | POA: Diagnosis not present

## 2020-11-27 DIAGNOSIS — R635 Abnormal weight gain: Secondary | ICD-10-CM | POA: Diagnosis not present

## 2020-12-06 DIAGNOSIS — E785 Hyperlipidemia, unspecified: Secondary | ICD-10-CM | POA: Insufficient documentation

## 2020-12-06 DIAGNOSIS — D1802 Hemangioma of intracranial structures: Secondary | ICD-10-CM | POA: Diagnosis not present

## 2020-12-06 DIAGNOSIS — E786 Lipoprotein deficiency: Secondary | ICD-10-CM | POA: Insufficient documentation

## 2020-12-06 DIAGNOSIS — Z6835 Body mass index (BMI) 35.0-35.9, adult: Secondary | ICD-10-CM | POA: Insufficient documentation

## 2020-12-06 DIAGNOSIS — E8881 Metabolic syndrome: Secondary | ICD-10-CM | POA: Insufficient documentation

## 2020-12-06 DIAGNOSIS — G43711 Chronic migraine without aura, intractable, with status migrainosus: Secondary | ICD-10-CM | POA: Diagnosis not present

## 2020-12-06 DIAGNOSIS — G4486 Cervicogenic headache: Secondary | ICD-10-CM | POA: Diagnosis not present

## 2020-12-06 DIAGNOSIS — R635 Abnormal weight gain: Secondary | ICD-10-CM | POA: Diagnosis not present

## 2020-12-06 DIAGNOSIS — E669 Obesity, unspecified: Secondary | ICD-10-CM | POA: Diagnosis not present

## 2020-12-18 DIAGNOSIS — F333 Major depressive disorder, recurrent, severe with psychotic symptoms: Secondary | ICD-10-CM | POA: Diagnosis not present

## 2020-12-24 DIAGNOSIS — E282 Polycystic ovarian syndrome: Secondary | ICD-10-CM | POA: Diagnosis not present

## 2020-12-24 DIAGNOSIS — M25562 Pain in left knee: Secondary | ICD-10-CM | POA: Diagnosis not present

## 2021-01-07 DIAGNOSIS — E282 Polycystic ovarian syndrome: Secondary | ICD-10-CM | POA: Diagnosis not present

## 2021-01-07 DIAGNOSIS — E669 Obesity, unspecified: Secondary | ICD-10-CM | POA: Diagnosis not present

## 2021-01-10 ENCOUNTER — Ambulatory Visit
Admission: RE | Admit: 2021-01-10 | Discharge: 2021-01-10 | Disposition: A | Discharge status: Home / Self Care | Payer: BC Managed Care – PPO | Source: Ambulatory Visit | Attending: Sports Medicine | Admitting: Sports Medicine

## 2021-01-10 ENCOUNTER — Other Ambulatory Visit: Payer: Self-pay

## 2021-01-10 ENCOUNTER — Encounter: Payer: Self-pay | Admitting: Sports Medicine

## 2021-01-10 ENCOUNTER — Ambulatory Visit: Payer: BC Managed Care – PPO | Admitting: Sports Medicine

## 2021-01-10 VITALS — BP 112/80 | Ht 70.0 in | Wt 235.0 lb

## 2021-01-10 DIAGNOSIS — M25562 Pain in left knee: Secondary | ICD-10-CM

## 2021-01-10 MED ORDER — MELOXICAM 15 MG PO TABS: ORAL_TABLET | ORAL | 0 refills | Status: DC

## 2021-01-10 NOTE — Patient Instructions (Addendum)
Your pain is likely related to overuse injury of your knee.  Take Meloxicam once daily for the next 7 days (and then as needed afterwards). We are getting x-rays to make sure there is no stress fracture, which is unlikely at this time. Make sure to keep weight off of the leg as you are able until your follow-up. We recommend doing low-impact exercises such as water aerobics or biking in the meantime.  Use your brace as needed to help with stability. Do the exercises prescribed for the next 3 weeks and follow-up back in clinic with [place].

## 2021-01-10 NOTE — Progress Notes (Addendum)
   Heather Gardner is a 40 y.o. female who presents to [facility] today for the following:  Left knee pain Left lateral knee pain for the last 4 weeks. Was packing/moving and increasing her walking exercises at this time. Noticed pain the day after walking 1.47miles in the AM and 75mile in the PM. No specific injury noted, no hx of fall. No history of injury/surgery of the knee. Worse with full extension (especially if elevated), sitting for long periods of time, forward bending, and squatting. Using aleve and ice with minimal improvement. Using knee brace, which helps. Fluctuates in severity and will "give out" if she gets too stiff.   PMH reviewed.  ROS as above. Medications reviewed. Has ASA allergy documented, but can tolerate aleve  Exam:  BP 112/80   Ht 5\' 10"  (1.778 m)   Wt 235 lb (106.6 kg)   BMI 33.72 kg/m  Gen: Well NAD MSK:  Left Knee: - Inspection: no gross deformity. No swelling/effusion, erythema or bruising. Skin intact - Palpation: mildly TTP over the lateral/inferior portion of the patella.  - ROM: full active ROM with flexion and extension in knee and hip. Staccato movement with active movement from full extension into flexion.  - Strength: 5/5 strength bilaterally - Neuro/vasc: NV intact bilaterally - Special Tests: - LIGAMENTS: negative anterior and posterior drawer, negative Lachman's, no MCL or LCL laxity  -- MENISCUS: negative McMurray's, negative Thessaly  -- PF JOINT: staccato movement of patella/quad with active ROM.  negative patellar grind, negative patellar apprehension  Hips: normal ROM, mild left knee pain with FABER and FADIR bilaterally   No results found.   Assessment and Plan: 1) left knee pain.  Likely overuse injury/patellofemoral syndrome.  Physical exam with some concern for decreased strength of the left quad muscle, reassuring for normal tracking of patella.  No concern at this time for ligamentous pathology.  Will obtain x-rays of the knee  to ensure no stress fracture or bony abnormality due to the persistence of the pain for 4 weeks.  Patient given isometric quad exercises and to minimize weightbearing as able to assist with healing.  Recommended continue use of knee brace.  Prescription for meloxicam given.  Rise Patience, DO   Patient seen and evaluated with the resident.  I agree with the above plan of care.  Would like to get some x-rays of her knee.  Phone follow-up with those results when available.  In the meantime, she will start some isometric quad strengthening exercises to work on her VMO weakness she may continue with her knee brace as well.  I would like to also try a short course of meloxicam.  Follow-up in 4 weeks for reevaluation.  In the meantime, she will avoid recreational walking and will instead stick to nonimpact exercises such as an exercise bike or cycling.  Addendum: X-rays reviewed.  Small joint effusion is seen but otherwise unremarkable.  We may need to consider merits of further diagnostic imaging if symptoms persist at follow-up.

## 2021-01-15 DIAGNOSIS — Z713 Dietary counseling and surveillance: Secondary | ICD-10-CM | POA: Diagnosis not present

## 2021-01-15 DIAGNOSIS — E669 Obesity, unspecified: Secondary | ICD-10-CM | POA: Diagnosis not present

## 2021-01-29 ENCOUNTER — Other Ambulatory Visit: Payer: Self-pay

## 2021-01-29 ENCOUNTER — Ambulatory Visit (INDEPENDENT_AMBULATORY_CARE_PROVIDER_SITE_OTHER): Payer: BC Managed Care – PPO | Admitting: Sports Medicine

## 2021-01-29 VITALS — Ht 70.0 in | Wt 232.0 lb

## 2021-01-29 DIAGNOSIS — M25562 Pain in left knee: Secondary | ICD-10-CM

## 2021-01-29 NOTE — Progress Notes (Signed)
   Subjective:    Patient ID: Heather Gardner, female    DOB: 10-18-1980, 40 y.o.   MRN: [id number]  HPI  Mirabelle presents today for follow-up on left knee pain.  Unfortunately, she still endorses lateral knee pain.  It is intermittent but definitely interfering with her ability to normally use her knee.  She endorses catching with shifting and twisting on the knee.  This results in some instability.  Meloxicam does help but has not been curative.  She has been doing her home exercises as well.  Pain all began several weeks ago without any known trauma.  She had recently started a new cardio workout routine prior to her symptoms beginning.  Recent x-rays showed no significant degenerative change but a small joint effusion.    Review of Systems As above    Objective:   Physical Exam  Well-developed, well-nourished.  No acute distress  Left knee: Full range of motion.  No obvious effusion.  1+ patellofemoral crepitus.  She is tender to palpation along the lateral joint line with a positive McMurray's and a positive Thessaly's.  Knee is grossly stable to ligamentous exam.  Neurovascularly intact distally.  Left knee x-rays are as above.      Assessment & Plan:   Persistent left knee pain worrisome for lateral meniscal tear  Patient has failed conservative treatment to date.  There is concern for a lateral meniscal tear which may need operative intervention.  We will obtain an MRI to evaluate further.  Phone follow-up with those results when available we will delineate a more definitive treatment plan based on those findings.

## 2021-01-30 DIAGNOSIS — G43719 Chronic migraine without aura, intractable, without status migrainosus: Secondary | ICD-10-CM | POA: Diagnosis not present

## 2021-01-30 DIAGNOSIS — G4486 Cervicogenic headache: Secondary | ICD-10-CM | POA: Diagnosis not present

## 2021-02-01 ENCOUNTER — Other Ambulatory Visit: Payer: Self-pay

## 2021-02-01 ENCOUNTER — Ambulatory Visit
Admission: RE | Admit: 2021-02-01 | Discharge: 2021-02-01 | Disposition: A | Discharge status: Home / Self Care | Payer: BC Managed Care – PPO | Source: Ambulatory Visit | Attending: Sports Medicine | Admitting: Sports Medicine

## 2021-02-01 DIAGNOSIS — M25562 Pain in left knee: Secondary | ICD-10-CM | POA: Diagnosis not present

## 2021-02-04 ENCOUNTER — Telehealth: Payer: Self-pay | Admitting: Sports Medicine

## 2021-02-04 NOTE — Telephone Encounter (Signed)
I spoke with Heather Gardner on the phone today after reviewing MRI findings of her left knee.  No evidence of meniscal pathology.  She has a minimal focal partial-thickness chondral surface irregularity of the superior aspect of the lateral patellar facet near the patellar apex.  This may be her pain generator.  She would like to return to the office for cortisone injection and referral for formal physical therapy.  She may set this up at her convenience.

## 2021-02-11 DIAGNOSIS — E282 Polycystic ovarian syndrome: Secondary | ICD-10-CM | POA: Diagnosis not present

## 2021-02-11 DIAGNOSIS — E669 Obesity, unspecified: Secondary | ICD-10-CM | POA: Diagnosis not present

## 2021-02-11 DIAGNOSIS — Z724 Inappropriate diet and eating habits: Secondary | ICD-10-CM | POA: Diagnosis not present

## 2021-02-20 ENCOUNTER — Other Ambulatory Visit: Payer: Self-pay | Admitting: Sports Medicine

## 2021-03-12 DIAGNOSIS — F3131 Bipolar disorder, current episode depressed, mild: Secondary | ICD-10-CM | POA: Diagnosis not present

## 2021-03-12 DIAGNOSIS — F900 Attention-deficit hyperactivity disorder, predominantly inattentive type: Secondary | ICD-10-CM | POA: Diagnosis not present

## 2021-03-12 DIAGNOSIS — F410 Panic disorder [episodic paroxysmal anxiety] without agoraphobia: Secondary | ICD-10-CM | POA: Diagnosis not present

## 2021-03-21 DIAGNOSIS — Z111 Encounter for screening for respiratory tuberculosis: Secondary | ICD-10-CM | POA: Diagnosis not present

## 2021-03-23 DIAGNOSIS — Z111 Encounter for screening for respiratory tuberculosis: Secondary | ICD-10-CM | POA: Diagnosis not present

## 2021-04-01 DIAGNOSIS — Z713 Dietary counseling and surveillance: Secondary | ICD-10-CM | POA: Diagnosis not present

## 2021-04-01 DIAGNOSIS — E669 Obesity, unspecified: Secondary | ICD-10-CM | POA: Diagnosis not present

## 2021-04-07 DIAGNOSIS — J209 Acute bronchitis, unspecified: Secondary | ICD-10-CM | POA: Diagnosis not present

## 2021-04-15 DIAGNOSIS — E282 Polycystic ovarian syndrome: Secondary | ICD-10-CM | POA: Diagnosis not present

## 2021-04-15 DIAGNOSIS — E669 Obesity, unspecified: Secondary | ICD-10-CM | POA: Diagnosis not present

## 2021-04-15 DIAGNOSIS — Z724 Inappropriate diet and eating habits: Secondary | ICD-10-CM | POA: Diagnosis not present

## 2021-04-23 DIAGNOSIS — F410 Panic disorder [episodic paroxysmal anxiety] without agoraphobia: Secondary | ICD-10-CM | POA: Diagnosis not present

## 2021-04-23 DIAGNOSIS — F900 Attention-deficit hyperactivity disorder, predominantly inattentive type: Secondary | ICD-10-CM | POA: Diagnosis not present

## 2021-04-23 DIAGNOSIS — F3132 Bipolar disorder, current episode depressed, moderate: Secondary | ICD-10-CM | POA: Diagnosis not present

## 2021-04-30 DIAGNOSIS — R0609 Other forms of dyspnea: Secondary | ICD-10-CM | POA: Diagnosis not present

## 2021-04-30 DIAGNOSIS — J019 Acute sinusitis, unspecified: Secondary | ICD-10-CM | POA: Diagnosis not present

## 2021-04-30 DIAGNOSIS — R059 Cough, unspecified: Secondary | ICD-10-CM | POA: Diagnosis not present

## 2021-04-30 DIAGNOSIS — J40 Bronchitis, not specified as acute or chronic: Secondary | ICD-10-CM | POA: Diagnosis not present

## 2021-05-02 DIAGNOSIS — Z1231 Encounter for screening mammogram for malignant neoplasm of breast: Secondary | ICD-10-CM | POA: Diagnosis not present

## 2021-05-22 DIAGNOSIS — L708 Other acne: Secondary | ICD-10-CM | POA: Diagnosis not present

## 2021-05-22 DIAGNOSIS — L718 Other rosacea: Secondary | ICD-10-CM | POA: Diagnosis not present

## 2021-05-22 DIAGNOSIS — D2339 Other benign neoplasm of skin of other parts of face: Secondary | ICD-10-CM | POA: Diagnosis not present

## 2021-05-22 DIAGNOSIS — L578 Other skin changes due to chronic exposure to nonionizing radiation: Secondary | ICD-10-CM | POA: Diagnosis not present

## 2021-06-04 DIAGNOSIS — D225 Melanocytic nevi of trunk: Secondary | ICD-10-CM | POA: Diagnosis not present

## 2021-06-04 DIAGNOSIS — Z1283 Encounter for screening for malignant neoplasm of skin: Secondary | ICD-10-CM | POA: Diagnosis not present

## 2021-06-28 DIAGNOSIS — F410 Panic disorder [episodic paroxysmal anxiety] without agoraphobia: Secondary | ICD-10-CM | POA: Diagnosis not present

## 2021-06-28 DIAGNOSIS — F900 Attention-deficit hyperactivity disorder, predominantly inattentive type: Secondary | ICD-10-CM | POA: Diagnosis not present

## 2021-06-28 DIAGNOSIS — F3132 Bipolar disorder, current episode depressed, moderate: Secondary | ICD-10-CM | POA: Diagnosis not present

## 2021-08-23 DIAGNOSIS — K120 Recurrent oral aphthae: Secondary | ICD-10-CM | POA: Diagnosis not present

## 2021-08-23 DIAGNOSIS — R1313 Dysphagia, pharyngeal phase: Secondary | ICD-10-CM | POA: Diagnosis not present

## 2021-08-23 DIAGNOSIS — F439 Reaction to severe stress, unspecified: Secondary | ICD-10-CM | POA: Diagnosis not present

## 2021-09-23 ENCOUNTER — Encounter: Payer: Self-pay | Admitting: Family

## 2021-09-23 ENCOUNTER — Ambulatory Visit (INDEPENDENT_AMBULATORY_CARE_PROVIDER_SITE_OTHER): Payer: BC Managed Care – PPO | Admitting: Family

## 2021-09-23 VITALS — BP 119/81 | HR 109 | Temp 99.9°F | Ht 70.0 in | Wt 245.5 lb

## 2021-09-23 DIAGNOSIS — R6889 Other general symptoms and signs: Secondary | ICD-10-CM

## 2021-09-23 MED ORDER — PANTOPRAZOLE SODIUM 20 MG PO TBEC
20.0000 mg | DELAYED_RELEASE_TABLET | Freq: Every day | ORAL | 0 refills | Status: DC
Start: 2021-09-23 — End: 2022-02-25

## 2021-09-23 NOTE — Patient Instructions (Signed)
Welcome to [facility]! It was a pleasure meeting you today. ? ?As discussed, your left ear has accumulated wax. Pick up generic Debrox ear drops over the counter and instil several drops into the ear for 3 nights, then after the 3rd night, shower and get water into the ear and shake gently, use a Q-tip on just the edges of your ear to see if any wax can be pulled out. If not, repeat steps above. ? ?I have sent Pantoprazole to your pharmacy to try and see if this helps the throat congestion/mucus you are feeling. Take as directed on the bottle. ? ?Please schedule a physical with fasting labs at your convenience for later in the year. ? ? ? ?PLEASE NOTE: ? ?If you had any LAB tests please let us know if you have not heard back within a few days. You may see your results on MyChart before we have a chance to review them but we will give you a call once they are reviewed by [place]. If we ordered any REFERRALS today, please let us know if you have not heard from their office within the next week.  ?Let us know through MyChart if you are needing REFILLS, or have your pharmacy send [place] the request. You can also use MyChart to communicate with me or any office staff. ? ?Please try these tips to maintain a healthy lifestyle: ? ?Eat most of your calories during the day when you are active. Eliminate processed foods including packaged sweets (pies, cakes, cookies), reduce intake of potatoes, white bread, white pasta, and white rice. Look for whole grain options, oat flour or almond flour. ? ?Each meal should contain half fruits/vegetables, one quarter protein, and one quarter carbs (no bigger than a computer mouse). ? ?Cut down on sweet beverages. This includes juice, soda, and sweet tea. Also watch fruit intake, though this is a healthier sweet option, it still contains natural sugar! Limit to 3 servings daily. ? ?Drink at least 1 glass of water with each meal and aim for at least 8 glasses per  day ? ?Exercise at least 150 minutes every week.  ? ?

## 2021-09-23 NOTE — Progress Notes (Signed)
? ?New Patient Office Visit ? ?Subjective:  ?Patient ID: Heather Gardner, female    DOB: 19-Mar-1981  Age: 41 y.o. MRN: [id number] ? ?CC:  ?Chief Complaint  ?Patient presents with  ? Establish Care  ? Allergies  ? ? ?HPI ?[name] presents for establishing care and to discuss one chronic problem. ?Throat congestion - pt c/o a lot of mucus in her throat for several months, she has to clear her throat often, feels sometimes as if something is stuck in her throat. Denies any dysphagia, heartburn, reflux sx, or hx of asthma. reports her previous PCP tried Flonase, oral antihistamine, mucinex, nothing seems to help. Pt concerned that providers feel this is just "in her head". Pt also wonders if related to one of meds she is taking, but states she has been on them for over a year and recent dose changes. States she has a future appt with ENT, but wondering if something can be done in meantime. ? ?Past Medical History:  ?Diagnosis Date  ? Anxiety   ? Headache   ? Intracranial cavernous hemangioma ([place]) 08/01/2015  ? PATIENT NAME Heather Gardner  MR NUMBER [id number]  EXAM COMPLETED 08/29/2014 15:58  ACCOUNT NUMBER [id number]  ORDERING PHYSICIAN MILLER, LISA L    -------------ADDITIONAL INFORMATION FROM PATIENT ORDER-------------  REASON headache  COMMENTS  NOTE  ----------------END OF ADDITIONAL INFORMATION----------------  CLINICAL DATA: Headache  -  EXAM:  MRI HEAD WITHOUT CONTRAST  TECHNIQUE:  Multiplanar, mul  ? Kidney infection   ? Major depression   ? PCOS (polycystic ovarian syndrome)   ? ? ?Past Surgical History:  ?Procedure Laterality Date  ? WISDOM TOOTH EXTRACTION  08/2015  ? ? ?Family History  ?Problem Relation Age of Onset  ? Heart disease Mother   ? Cancer Mother   ?     skin  ? Alcohol abuse Mother   ? Depression Mother   ? Prostate cancer Father   ? Throat cancer Father   ? Cancer Father   ?     skin  ? Alcohol abuse Father   ? Drug abuse Father   ? Depression Father   ? Dementia Father   ?     lewey body  ?  Bipolar disorder Sister   ? Anxiety disorder Brother   ? Kidney failure Maternal Grandmother   ? Brain cancer Maternal Grandfather   ? Lung cancer Maternal Grandfather   ? Autism Son   ? Anxiety disorder Paternal Grandmother   ? ? ?Social History  ? ?Socioeconomic History  ? Marital status: Soil scientist  ?  Spouse name: Not on file  ? Number of children: 1  ? Years of education: Not on file  ? Highest education level: Master's degree (e.g., MA, MS, MEng, MEd, MSW, MBA)  ?Occupational History  ? Occupation: [employer]  ?Tobacco Use  ? Smoking status: Former  ?  Packs/day: 0.25  ?  Types: Cigarettes  ?  Quit date: 06/30/2016  ?  Years since quitting: 5.2  ? Smokeless tobacco: Never  ? Tobacco comments:  ?  08/22/15 smoke when stressed  ?Vaping Use  ? Vaping Use: Never used  ?Substance and Sexual Activity  ? Alcohol use: No  ?  Alcohol/week: 0.0 standard drinks  ? Drug use: No  ? Sexual activity: Yes  ?  Birth control/protection: I.U.D.  ?Other Topics Concern  ? Not on file  ?Social History Narrative  ? Lives at home w/ her husband and son  ? Right-handed  ?  Caffeine: rare  ? ?Social Determinants of Health  ? ?Financial Resource Strain: Not on file  ?Food Insecurity: Not on file  ?Transportation Needs: Not on file  ?Physical Activity: Not on file  ?Stress: Not on file  ?Social Connections: Not on file  ?Intimate Partner Violence: Not on file  ? ? ?Objective:  ? ?Today's Vitals: BP 119/81   Pulse (!) 109   Temp 99.9 ?F (37.7 ?C)   Ht '5\' 10"'$  (1.778 m)   Wt 245 lb 8 oz (111.4 kg)   SpO2 97%   BMI 35.23 kg/m?  ? ?Physical Exam ?Vitals and nursing note reviewed.  ?Constitutional:   ?   Appearance: Normal appearance.  ?HENT:  ?   Right Ear: Tympanic membrane and ear canal normal.  ?   Left Ear: Tympanic membrane and ear canal normal.  ?   Mouth/Throat:  ?   Mouth: Mucous membranes are moist.  ?   Pharynx: No pharyngeal swelling, oropharyngeal exudate, posterior oropharyngeal erythema or uvula swelling.   ?Cardiovascular:  ?   Rate and Rhythm: Normal rate and regular rhythm.  ?Pulmonary:  ?   Effort: Pulmonary effort is normal.  ?   Breath sounds: Normal breath sounds.  ?Musculoskeletal:     ?   General: Normal range of motion.  ?Skin: ?   General: Skin is warm and dry.  ?Neurological:  ?   Mental Status: She is alert.  ?Psychiatric:     ?   Mood and Affect: Mood normal.     ?   Behavior: Behavior normal.  ? ? ?Assessment & Plan:  ? ?Problem List Items Addressed This Visit   ?None ?Visit Diagnoses   ? ? Throat congestion    -  Primary  ? pt reports using Flonase and oral antihistamine correctly with no resolution to sx. Recommend a trial of Protonix to r/o reflux etiology even though no other sx. Advised pt on use & SE. Also advised to keep appt with ENT as she may have a polyp or some other cause for her sx that they are better equipped to assess. ? ?Relevant Medications  ? pantoprazole (PROTONIX) 20 MG tablet  ? ?  ? ?Outpatient Encounter Medications as of 09/23/2021  ?Medication Sig  ? pantoprazole (PROTONIX) 20 MG tablet Take 1 tablet (20 mg total) by mouth daily. Take 1 pill twice a day for 2 weeks, then 1 pill every morning.  ? clonazePAM (KLONOPIN) 1 MG tablet Take by mouth 3 (three) times daily. Takes '2mg'$   at bedtime and 1 mg daily as needed.  ? lamoTRIgine (LAMICTAL) 200 MG tablet Take 400 mg by mouth daily.  ? levonorgestrel (MIRENA) 20 MCG/24HR IUD 1 each by Intrauterine route continuous.  ? ondansetron (ZOFRAN) 4 MG tablet Take 1 tablet (4 mg total) by mouth every 8 (eight) hours as needed for nausea or vomiting.  ? QUEtiapine (SEROQUEL XR) 300 MG 24 hr tablet Take 300 mg by mouth at bedtime.  ? SUMAtriptan (IMITREX) 6 MG/0.5ML SOLN injection Take one dose at headache onset, can take additional dose 2hrs later if needed. No more then 2 injections in 24hrs  ? Ubrogepant (UBRELVY) 100 MG TABS Take 1 tablet at the onset of a migraine.  Can repeat in 2 hours if needed.  Only 2 tablets every 24 hours  ?  [DISCONTINUED] cariprazine (VRAYLAR) capsule 1 capsule  ? [DISCONTINUED] Galcanezumab-gnlm (EMGALITY) 120 MG/ML SOAJ Inject 120 mg into the skin every 30 (thirty) days.  ? [DISCONTINUED] meloxicam (  MOBIC) 15 MG tablet Take 1 tablet daily with food for 7 days. Then take as needed.  ? [DISCONTINUED] methylphenidate (RITALIN) 20 MG tablet TAKE 1 TABLET BY MOUTH EVERY MORNING, 1 TABLET AT NOON, AND 1 TABLET AT 4PM  ? [DISCONTINUED] predniSONE (DELTASONE) 5 MG tablet Begin taking 6 tablets daily, taper by one tablet daily until off the medication.  ? ?No facility-administered encounter medications on file as of 09/23/2021.  ? ? ?Follow-up: Return for any future concerns.  ? ?Jeanie Sewer, NP ? ?

## 2021-10-03 ENCOUNTER — Ambulatory Visit: Payer: BC Managed Care – PPO | Admitting: Family

## 2021-10-08 DIAGNOSIS — F4322 Adjustment disorder with anxiety: Secondary | ICD-10-CM | POA: Diagnosis not present

## 2021-10-14 DIAGNOSIS — F410 Panic disorder [episodic paroxysmal anxiety] without agoraphobia: Secondary | ICD-10-CM | POA: Diagnosis not present

## 2021-10-14 DIAGNOSIS — F3174 Bipolar disorder, in full remission, most recent episode manic: Secondary | ICD-10-CM | POA: Diagnosis not present

## 2021-10-14 DIAGNOSIS — F3176 Bipolar disorder, in full remission, most recent episode depressed: Secondary | ICD-10-CM | POA: Diagnosis not present

## 2021-10-14 DIAGNOSIS — F900 Attention-deficit hyperactivity disorder, predominantly inattentive type: Secondary | ICD-10-CM | POA: Diagnosis not present

## 2021-10-15 DIAGNOSIS — F4322 Adjustment disorder with anxiety: Secondary | ICD-10-CM | POA: Diagnosis not present

## 2021-10-21 DIAGNOSIS — F4312 Post-traumatic stress disorder, chronic: Secondary | ICD-10-CM | POA: Diagnosis not present

## 2021-10-21 DIAGNOSIS — F39 Unspecified mood [affective] disorder: Secondary | ICD-10-CM | POA: Diagnosis not present

## 2021-10-22 DIAGNOSIS — F4322 Adjustment disorder with anxiety: Secondary | ICD-10-CM | POA: Diagnosis not present

## 2021-10-29 DIAGNOSIS — F3181 Bipolar II disorder: Secondary | ICD-10-CM | POA: Diagnosis not present

## 2021-10-29 DIAGNOSIS — F909 Attention-deficit hyperactivity disorder, unspecified type: Secondary | ICD-10-CM | POA: Diagnosis not present

## 2021-10-29 DIAGNOSIS — F4322 Adjustment disorder with anxiety: Secondary | ICD-10-CM | POA: Diagnosis not present

## 2021-10-29 DIAGNOSIS — F4312 Post-traumatic stress disorder, chronic: Secondary | ICD-10-CM | POA: Diagnosis not present

## 2021-11-06 DIAGNOSIS — F4322 Adjustment disorder with anxiety: Secondary | ICD-10-CM | POA: Diagnosis not present

## 2021-11-11 DIAGNOSIS — F4322 Adjustment disorder with anxiety: Secondary | ICD-10-CM | POA: Diagnosis not present

## 2021-11-11 DIAGNOSIS — K219 Gastro-esophageal reflux disease without esophagitis: Secondary | ICD-10-CM | POA: Insufficient documentation

## 2021-11-11 DIAGNOSIS — R0989 Other specified symptoms and signs involving the circulatory and respiratory systems: Secondary | ICD-10-CM | POA: Insufficient documentation

## 2021-11-11 DIAGNOSIS — J342 Deviated nasal septum: Secondary | ICD-10-CM | POA: Diagnosis not present

## 2021-11-11 DIAGNOSIS — H6122 Impacted cerumen, left ear: Secondary | ICD-10-CM | POA: Insufficient documentation

## 2021-11-11 DIAGNOSIS — J3489 Other specified disorders of nose and nasal sinuses: Secondary | ICD-10-CM | POA: Diagnosis not present

## 2021-11-18 DIAGNOSIS — F4322 Adjustment disorder with anxiety: Secondary | ICD-10-CM | POA: Diagnosis not present

## 2021-11-20 DIAGNOSIS — F4322 Adjustment disorder with anxiety: Secondary | ICD-10-CM | POA: Diagnosis not present

## 2021-11-26 DIAGNOSIS — F909 Attention-deficit hyperactivity disorder, unspecified type: Secondary | ICD-10-CM | POA: Diagnosis not present

## 2021-11-26 DIAGNOSIS — F4312 Post-traumatic stress disorder, chronic: Secondary | ICD-10-CM | POA: Diagnosis not present

## 2021-11-26 DIAGNOSIS — F3181 Bipolar II disorder: Secondary | ICD-10-CM | POA: Diagnosis not present

## 2021-11-27 DIAGNOSIS — F4322 Adjustment disorder with anxiety: Secondary | ICD-10-CM | POA: Diagnosis not present

## 2021-11-28 ENCOUNTER — Encounter: Payer: BC Managed Care – PPO | Admitting: Radiology

## 2021-12-02 DIAGNOSIS — F4322 Adjustment disorder with anxiety: Secondary | ICD-10-CM | POA: Diagnosis not present

## 2021-12-05 DIAGNOSIS — F4322 Adjustment disorder with anxiety: Secondary | ICD-10-CM | POA: Diagnosis not present

## 2021-12-14 DIAGNOSIS — F4322 Adjustment disorder with anxiety: Secondary | ICD-10-CM | POA: Diagnosis not present

## 2021-12-20 DIAGNOSIS — F4322 Adjustment disorder with anxiety: Secondary | ICD-10-CM | POA: Diagnosis not present

## 2021-12-25 DIAGNOSIS — G4486 Cervicogenic headache: Secondary | ICD-10-CM | POA: Diagnosis not present

## 2021-12-25 DIAGNOSIS — G43719 Chronic migraine without aura, intractable, without status migrainosus: Secondary | ICD-10-CM | POA: Diagnosis not present

## 2021-12-30 DIAGNOSIS — F4322 Adjustment disorder with anxiety: Secondary | ICD-10-CM | POA: Diagnosis not present

## 2022-01-08 DIAGNOSIS — F4322 Adjustment disorder with anxiety: Secondary | ICD-10-CM | POA: Diagnosis not present

## 2022-01-10 DIAGNOSIS — G4486 Cervicogenic headache: Secondary | ICD-10-CM | POA: Diagnosis not present

## 2022-01-10 DIAGNOSIS — G43709 Chronic migraine without aura, not intractable, without status migrainosus: Secondary | ICD-10-CM | POA: Diagnosis not present

## 2022-01-15 DIAGNOSIS — F4322 Adjustment disorder with anxiety: Secondary | ICD-10-CM | POA: Diagnosis not present

## 2022-01-20 DIAGNOSIS — F4312 Post-traumatic stress disorder, chronic: Secondary | ICD-10-CM | POA: Diagnosis not present

## 2022-01-20 DIAGNOSIS — F909 Attention-deficit hyperactivity disorder, unspecified type: Secondary | ICD-10-CM | POA: Diagnosis not present

## 2022-01-20 DIAGNOSIS — F3181 Bipolar II disorder: Secondary | ICD-10-CM | POA: Diagnosis not present

## 2022-01-22 DIAGNOSIS — L818 Other specified disorders of pigmentation: Secondary | ICD-10-CM | POA: Diagnosis not present

## 2022-01-22 DIAGNOSIS — L02426 Furuncle of left lower limb: Secondary | ICD-10-CM | POA: Diagnosis not present

## 2022-01-22 DIAGNOSIS — L02425 Furuncle of right lower limb: Secondary | ICD-10-CM | POA: Diagnosis not present

## 2022-01-22 DIAGNOSIS — L0232 Furuncle of buttock: Secondary | ICD-10-CM | POA: Diagnosis not present

## 2022-01-23 ENCOUNTER — Other Ambulatory Visit: Payer: Self-pay | Admitting: Gastroenterology

## 2022-01-23 DIAGNOSIS — R1030 Lower abdominal pain, unspecified: Secondary | ICD-10-CM

## 2022-01-23 DIAGNOSIS — F4322 Adjustment disorder with anxiety: Secondary | ICD-10-CM | POA: Diagnosis not present

## 2022-01-23 DIAGNOSIS — K625 Hemorrhage of anus and rectum: Secondary | ICD-10-CM

## 2022-01-30 DIAGNOSIS — F4322 Adjustment disorder with anxiety: Secondary | ICD-10-CM | POA: Diagnosis not present

## 2022-02-05 DIAGNOSIS — F4322 Adjustment disorder with anxiety: Secondary | ICD-10-CM | POA: Diagnosis not present

## 2022-02-06 ENCOUNTER — Ambulatory Visit
Admission: RE | Admit: 2022-02-06 | Discharge: 2022-02-06 | Disposition: A | Discharge status: Home / Self Care | Payer: BC Managed Care – PPO | Source: Ambulatory Visit | Attending: Gastroenterology | Admitting: Gastroenterology

## 2022-02-06 DIAGNOSIS — K760 Fatty (change of) liver, not elsewhere classified: Secondary | ICD-10-CM | POA: Diagnosis not present

## 2022-02-06 DIAGNOSIS — R1030 Lower abdominal pain, unspecified: Secondary | ICD-10-CM

## 2022-02-06 DIAGNOSIS — K828 Other specified diseases of gallbladder: Secondary | ICD-10-CM | POA: Diagnosis not present

## 2022-02-06 DIAGNOSIS — K625 Hemorrhage of anus and rectum: Secondary | ICD-10-CM

## 2022-02-06 DIAGNOSIS — R918 Other nonspecific abnormal finding of lung field: Secondary | ICD-10-CM | POA: Diagnosis not present

## 2022-02-06 DIAGNOSIS — K429 Umbilical hernia without obstruction or gangrene: Secondary | ICD-10-CM | POA: Diagnosis not present

## 2022-02-06 MED ORDER — IOPAMIDOL (ISOVUE-300) INJECTION 61%
100.0000 mL | Freq: Once | INTRAVENOUS | Status: AC | PRN
  Administered 2022-02-06: 100 mL via INTRAVENOUS

## 2022-02-12 ENCOUNTER — Encounter: Payer: Self-pay | Admitting: Family

## 2022-02-12 ENCOUNTER — Ambulatory Visit: Payer: BC Managed Care – PPO | Admitting: Family

## 2022-02-12 DIAGNOSIS — F4322 Adjustment disorder with anxiety: Secondary | ICD-10-CM | POA: Diagnosis not present

## 2022-02-19 ENCOUNTER — Encounter: Payer: Self-pay | Admitting: Family

## 2022-02-19 DIAGNOSIS — R911 Solitary pulmonary nodule: Secondary | ICD-10-CM | POA: Insufficient documentation

## 2022-02-19 DIAGNOSIS — K760 Fatty (change of) liver, not elsewhere classified: Secondary | ICD-10-CM | POA: Insufficient documentation

## 2022-02-21 DIAGNOSIS — F4322 Adjustment disorder with anxiety: Secondary | ICD-10-CM | POA: Diagnosis not present

## 2022-02-24 NOTE — Progress Notes (Unsigned)
   Patient ID: Heather Gardner, female    DOB: 1981-06-12, 41 y.o.   MRN: [id number]  No chief complaint on file.   HPI:   Assessment & Plan:     Subjective:    Outpatient Medications Prior to Visit  Medication Sig Dispense Refill  . clonazePAM (KLONOPIN) 1 MG tablet Take by mouth 3 (three) times daily. Takes '2mg'$   at bedtime and 1 mg daily as needed.    . lamoTRIgine (LAMICTAL) 200 MG tablet Take 400 mg by mouth daily.    Marland Kitchen levonorgestrel (MIRENA) 20 MCG/24HR IUD 1 each by Intrauterine route continuous.    . ondansetron (ZOFRAN) 4 MG tablet Take 1 tablet (4 mg total) by mouth every 8 (eight) hours as needed for nausea or vomiting. 20 tablet 0  . pantoprazole (PROTONIX) 20 MG tablet Take 1 tablet (20 mg total) by mouth daily. Take 1 pill twice a day for 2 weeks, then 1 pill every morning. 30 tablet 0  . QUEtiapine (SEROQUEL XR) 300 MG 24 hr tablet Take 300 mg by mouth at bedtime.    . SUMAtriptan (IMITREX) 6 MG/0.5ML SOLN injection Take one dose at headache onset, can take additional dose 2hrs later if needed. No more then 2 injections in 24hrs 6 mL 11  . Ubrogepant (UBRELVY) 100 MG TABS Take 1 tablet at the onset of a migraine.  Can repeat in 2 hours if needed.  Only 2 tablets every 24 hours 10 tablet 11   No facility-administered medications prior to visit.   Past Medical History:  Diagnosis Date  . Anxiety   . Headache   . Intracranial cavernous hemangioma ([place]) 08/01/2015   PATIENT NAME Heather Gardner  MR NUMBER [id number]  EXAM COMPLETED 08/29/2014 15:58  ACCOUNT NUMBER [id number]  ORDERING PHYSICIAN MILLER, LISA L    -------------ADDITIONAL INFORMATION FROM PATIENT ORDER-------------  REASON headache  COMMENTS  NOTE  ----------------END OF ADDITIONAL INFORMATION----------------  CLINICAL DATA: Headache  -  EXAM:  MRI HEAD WITHOUT CONTRAST  TECHNIQUE:  Multiplanar, mul  . Kidney infection   . Major depression   . PCOS (polycystic ovarian syndrome)    Past Surgical History:   Procedure Laterality Date  . WISDOM TOOTH EXTRACTION  08/2015   Allergies  Allergen Reactions  . Lithium Nausea And Vomiting  . Asa [Aspirin] Other (See Comments)    Patient stated that she can not take any blood thinners      Objective:    Physical Exam Vitals and nursing note reviewed.  Constitutional:      Appearance: Normal appearance.  Cardiovascular:     Rate and Rhythm: Normal rate and regular rhythm.  Pulmonary:     Effort: Pulmonary effort is normal.     Breath sounds: Normal breath sounds.  Musculoskeletal:        General: Normal range of motion.  Skin:    General: Skin is warm and dry.  Neurological:     Mental Status: She is alert.  Psychiatric:        Mood and Affect: Mood normal.        Behavior: Behavior normal.  There were no vitals taken for this visit. Wt Readings from Last 3 Encounters:  09/23/21 245 lb 8 oz (111.4 kg)  01/29/21 232 lb (105.2 kg)  01/10/21 235 lb (106.6 kg)       Jeanie Sewer, NP

## 2022-02-25 ENCOUNTER — Ambulatory Visit (INDEPENDENT_AMBULATORY_CARE_PROVIDER_SITE_OTHER): Payer: BC Managed Care – PPO | Admitting: Family

## 2022-02-25 ENCOUNTER — Encounter: Payer: Self-pay | Admitting: Family

## 2022-02-25 VITALS — BP 110/80 | HR 83 | Temp 98.3°F | Ht 70.0 in | Wt 242.0 lb

## 2022-02-25 DIAGNOSIS — R2232 Localized swelling, mass and lump, left upper limb: Secondary | ICD-10-CM

## 2022-02-25 DIAGNOSIS — R635 Abnormal weight gain: Secondary | ICD-10-CM | POA: Diagnosis not present

## 2022-02-25 DIAGNOSIS — Z23 Encounter for immunization: Secondary | ICD-10-CM

## 2022-02-25 DIAGNOSIS — K429 Umbilical hernia without obstruction or gangrene: Secondary | ICD-10-CM | POA: Diagnosis not present

## 2022-02-25 NOTE — Assessment & Plan Note (Addendum)
   Chronic  able to lose about 20lbs thru [facility] last year  has regained about 10lbs  advised pt to check on insurance to see if any meds covered  wt. Loss strategies reviewed including portion control, less carbs including sweets, eating most of calories earlier in day, drinking 64oz water qd, and establishing daily exercise routine.  advised scheduling physical w/fasting labs to see if any pre-diabetes or high cholesterol to use as comorbidities for meds

## 2022-02-25 NOTE — Patient Instructions (Addendum)
It was very nice to see you today!   Check with your insurance regarding weight loss medications: Wegovy, Ozempic, Saxenda (injectables) or you can go online to see if Qsymia or Plenity (pills) may be an option, they are sent via mailorder.  Schedule a physical with fasting labs today.      PLEASE NOTE:  If you had any lab tests please let us know if you have not heard back within a few days. You may see your results on MyChart before we have a chance to review them but we will give you a call once they are reviewed by [place]. If we ordered any referrals today, please let us know if you have not heard from their office within the next week.

## 2022-02-26 DIAGNOSIS — F4322 Adjustment disorder with anxiety: Secondary | ICD-10-CM | POA: Diagnosis not present

## 2022-03-04 DIAGNOSIS — F4322 Adjustment disorder with anxiety: Secondary | ICD-10-CM | POA: Diagnosis not present

## 2022-03-05 DIAGNOSIS — G4486 Cervicogenic headache: Secondary | ICD-10-CM | POA: Diagnosis not present

## 2022-03-05 DIAGNOSIS — G43719 Chronic migraine without aura, intractable, without status migrainosus: Secondary | ICD-10-CM | POA: Diagnosis not present

## 2022-03-06 ENCOUNTER — Ambulatory Visit (INDEPENDENT_AMBULATORY_CARE_PROVIDER_SITE_OTHER): Payer: BC Managed Care – PPO | Admitting: Family

## 2022-03-06 ENCOUNTER — Encounter: Payer: Self-pay | Admitting: Family

## 2022-03-06 VITALS — BP 98/68 | HR 91 | Temp 98.8°F | Ht 70.0 in | Wt 244.6 lb

## 2022-03-06 DIAGNOSIS — S80811A Abrasion, right lower leg, initial encounter: Secondary | ICD-10-CM

## 2022-03-06 DIAGNOSIS — L089 Local infection of the skin and subcutaneous tissue, unspecified: Secondary | ICD-10-CM | POA: Diagnosis not present

## 2022-03-06 MED ORDER — DOXYCYCLINE HYCLATE 100 MG PO TABS: 100.0000 mg | ORAL_TABLET | Freq: Two times a day (BID) | ORAL | 0 refills | Status: DC

## 2022-03-06 NOTE — Progress Notes (Signed)
Patient ID: Heather Gardner, female    DOB: March 24, 1981, 41 y.o.   MRN: [id number]  Chief Complaint  Patient presents with   Leg Injury    Pt fell last Wednesday, and just started getting worse, pain, numbness and cant bend knee, discoloration in ankle    HPI: Leg pain:  fell on sidewalk a week ago and she scraped right anterior leg, approx 2nd degree skin abrasion, pt reports dabbing paper towels, applied neosporin until she got home and then cleaned with water. Has been washing lightly with soap and water, but over last 2 days she states it is more painful, swollen with swelling down in ankle and she can't easily bend her knee.     Assessment & Plan:  1. Leg abrasion, infected, right, initial encounter large scrape on anterior leg with approx. 3cm of erythema all around perimeter of wound, warm to touch, swelling noted, tender to palpation. Sending DOXY, advised pt on use & SE. Wash leg with soap and water daily, no need to cover. Advised on return protocol.  - doxycycline (VIBRA-TABS) 100 MG tablet; Take 1 tablet (100 mg total) by mouth 2 (two) times daily.  Dispense: 14 tablet; Refill: 0   Subjective:    Outpatient Medications Prior to Visit  Medication Sig Dispense Refill   amphetamine-dextroamphetamine (ADDERALL) 20 MG tablet Take 10-20 mg by mouth 2 (two) times daily. TAKES 10 MG IN THE AM AND 20 MG IN THE AFTERNOON     clonazePAM (KLONOPIN) 1 MG tablet Take by mouth 3 (three) times daily. Takes 1/2 mg at bedtime and can take up to 1 mg during the day as needed     ibuprofen (ADVIL) 400 MG tablet Take by mouth.     lamoTRIgine (LAMICTAL) 200 MG tablet Take 400 mg by mouth daily.     levonorgestrel (MIRENA) 20 MCG/24HR IUD 1 each by Intrauterine route continuous.     Multiple Vitamins-Minerals (MULTIVITAMIN GUMMIES WOMENS) CHEW      ondansetron (ZOFRAN) 4 MG tablet Take 1 tablet (4 mg total) by mouth every 8 (eight) hours as needed for nausea or vomiting. 20 tablet 0   Probiotic  Product (PROBIOTIC DAILY) CAPS      QUEtiapine (SEROQUEL) 300 MG tablet Take 300 mg by mouth at bedtime.     SUMAtriptan (IMITREX) 6 MG/0.5ML SOLN injection Take one dose at headache onset, can take additional dose 2hrs later if needed. No more then 2 injections in 24hrs 6 mL 11   Ubrogepant (UBRELVY) 100 MG TABS Take 1 tablet at the onset of a migraine.  Can repeat in 2 hours if needed.  Only 2 tablets every 24 hours 10 tablet 11   No facility-administered medications prior to visit.   Past Medical History:  Diagnosis Date   Anxiety    Headache    Intracranial cavernous hemangioma ([place]) 08/01/2015   PATIENT NAME Heather Gardner  MR NUMBER [id number]  EXAM COMPLETED 08/29/2014 15:58  ACCOUNT NUMBER [id number]  ORDERING PHYSICIAN MILLER, LISA L    -------------ADDITIONAL INFORMATION FROM PATIENT ORDER-------------  REASON headache  COMMENTS  NOTE  ----------------END OF ADDITIONAL INFORMATION----------------  CLINICAL DATA: Headache  -  EXAM:  MRI HEAD WITHOUT CONTRAST  TECHNIQUE:  Multiplanar, mul   Kidney infection    Major depression    PCOS (polycystic ovarian syndrome)    Past Surgical History:  Procedure Laterality Date   WISDOM TOOTH EXTRACTION  08/2015   Allergies  Allergen Reactions   Lithium Nausea And Vomiting  Asa [Aspirin] Other (See Comments)    Patient stated that she can not take any blood thinners      Objective:    Physical Exam Vitals and nursing note reviewed.  Constitutional:      Appearance: Normal appearance.  Cardiovascular:     Rate and Rhythm: Normal rate and regular rhythm.  Pulmonary:     Effort: Pulmonary effort is normal.     Breath sounds: Normal breath sounds.  Musculoskeletal:        General: Normal range of motion.  Skin:    General: Skin is warm and dry.     Findings: Abrasion (anterior right leg, just distal of knee down almost to ankle, approx 9inx5in, with eschar, 3cm perimeter of erythema) present.       Neurological:     Mental Status:  She is alert.  Psychiatric:        Mood and Affect: Mood normal.        Behavior: Behavior normal.    BP 98/68   Pulse 91   Temp 98.8 F (37.1 C)   Ht '5\' 10"'$  (1.778 m)   Wt 244 lb 9.6 oz (110.9 kg)   SpO2 96%   BMI 35.10 kg/m  Wt Readings from Last 3 Encounters:  03/06/22 244 lb 9.6 oz (110.9 kg)  02/25/22 242 lb (109.8 kg)  09/23/21 245 lb 8 oz (111.4 kg)       Jeanie Sewer, NP

## 2022-03-06 NOTE — Patient Instructions (Signed)
It was very nice to see you today!    As discussed, start the antibiotic sent to your pharmacy today.  Wash your leg once a day with soap & water, no antibiotic ointment needed. Elevate your leg whenever resting. Can apply ice over a thin towel for up to 20 minutes at a time to help swelling and pain. OK to take up to 3 Ibuprofen/Advil 3 times per day as needed for pain.  Let me know if swelling and redness is not better after a few days.       PLEASE NOTE:  If you had any lab tests please let us know if you have not heard back within a few days. You may see your results on MyChart before we have a chance to review them but we will give you a call once they are reviewed by [place]. If we ordered any referrals today, please let us know if you have not heard from their office within the next week.

## 2022-03-08 IMAGING — MR MR KNEE*L* W/O CM
5 of 7 series · 22 of 40 positions shown · non-contrast
Comparison: X-ray 01/10/2021

CLINICAL DATA: Lateral knee pain for 8 weeks

EXAM:
MRI OF THE LEFT KNEE WITHOUT CONTRAST
TECHNIQUE: Multiplanar, multisequence MR imaging of the knee was performed. No
intravenous contrast was administered.

[Series 3: T2 fat-sat · axial · 4.0mm · 0.50mm/px · z∈[-78,+37]mm · 5 of 24 slices shown (1 of 2)]
[im 1/24]
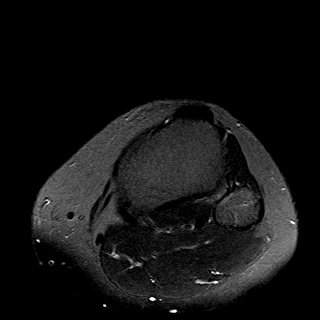
[im 6/24]
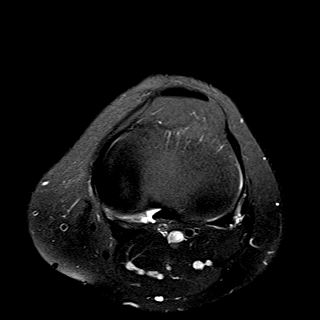
[im 12/24]
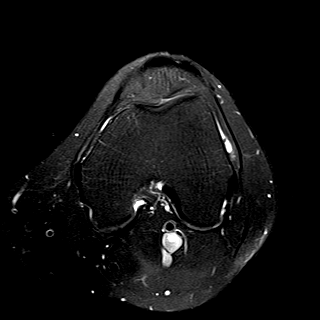
[im 18/24]
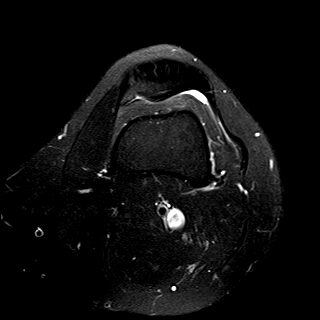
[im 24/24]
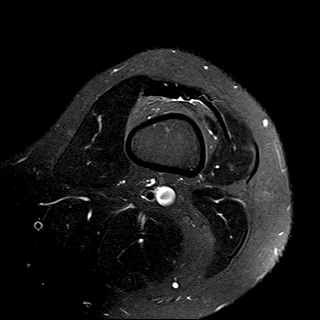

[Series 5: T2 fat-sat · coronal · 4.0mm · 0.29mm/px · 1 of 22 slices shown (2 of 2)]
[im 1/22]
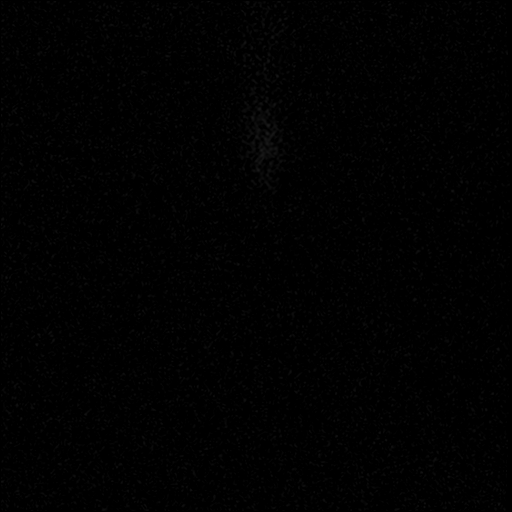

[Series 6: PD fat-sat · coronal · 4.0mm · 0.29mm/px · 6 of 22 slices shown (1 of 3)]
[im 1/22]
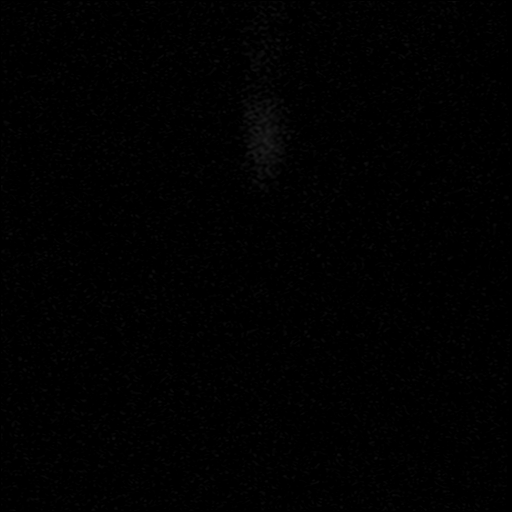
[im 5/22]
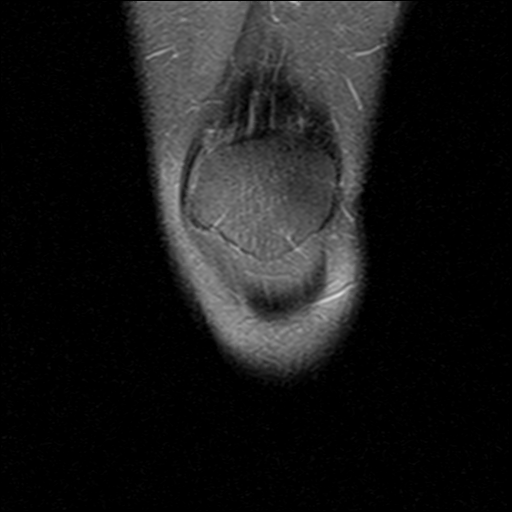
[im 9/22]
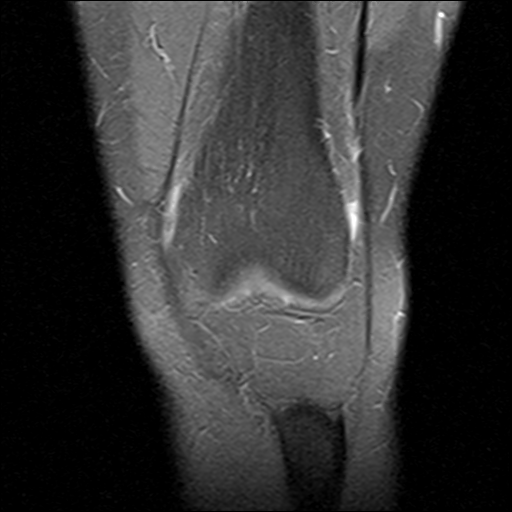
[im 13/22]
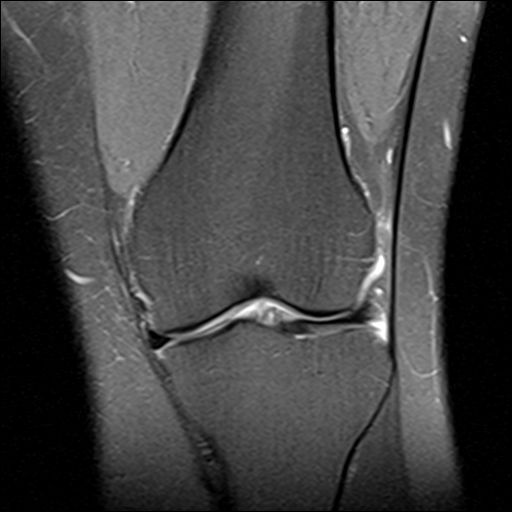
[im 17/22]
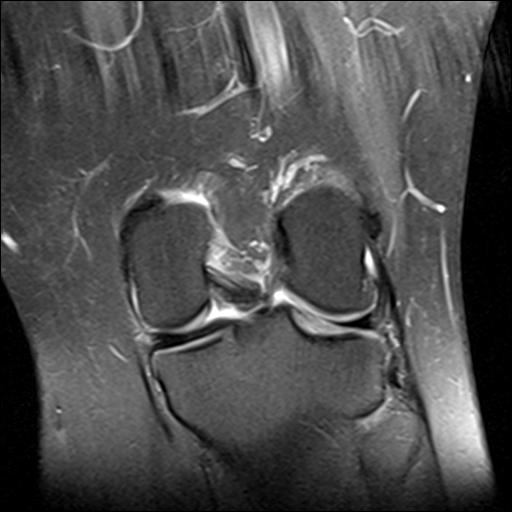
[im 22/22]
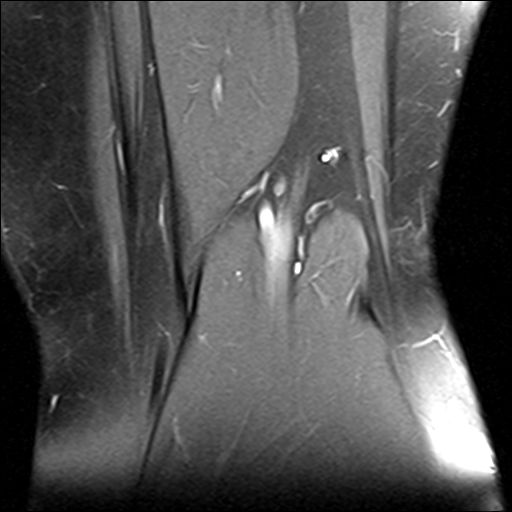

[Series 8: PD fat-sat · sagittal · 3.0mm · 0.29mm/px · 7 of 27 slices shown (2 of 3)]
[im 1/27]
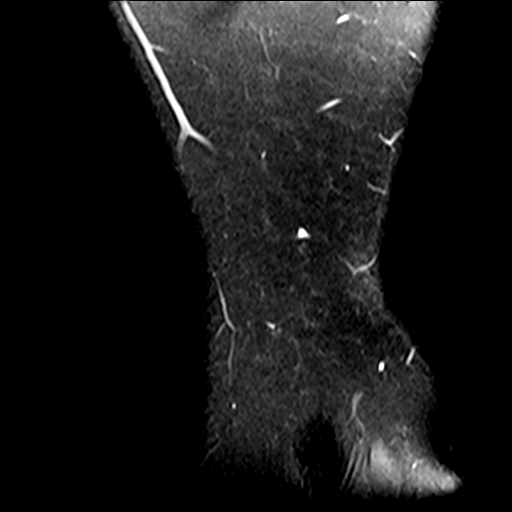
[im 5/27]
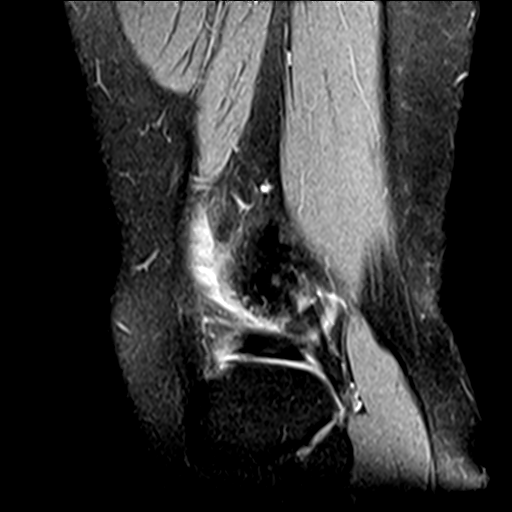
[im 9/27]
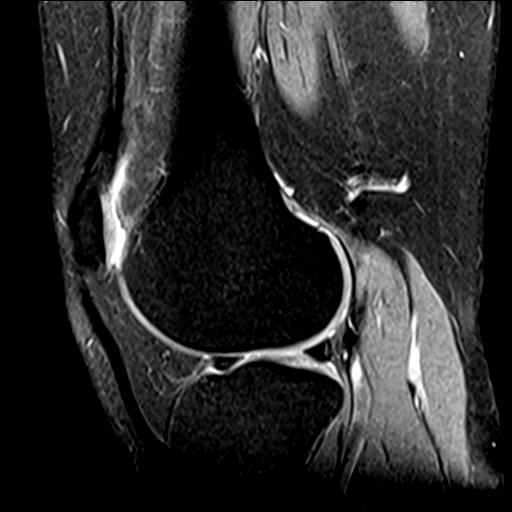
[im 14/27]
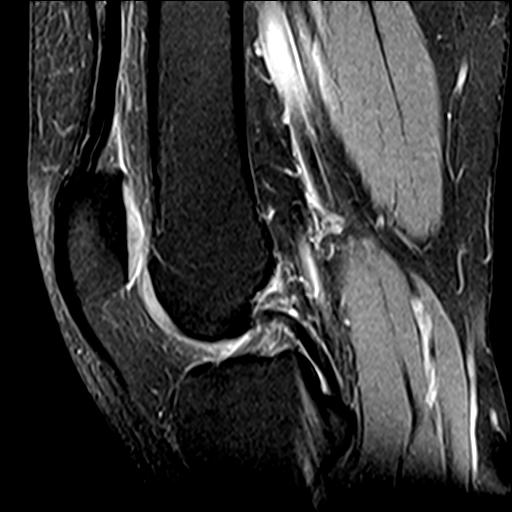
[im 18/27]
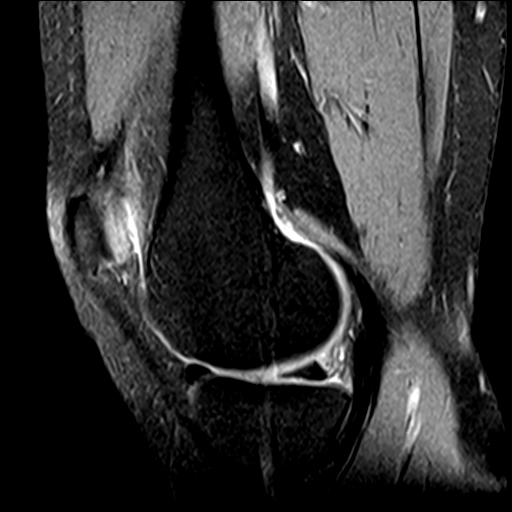
[im 22/27]
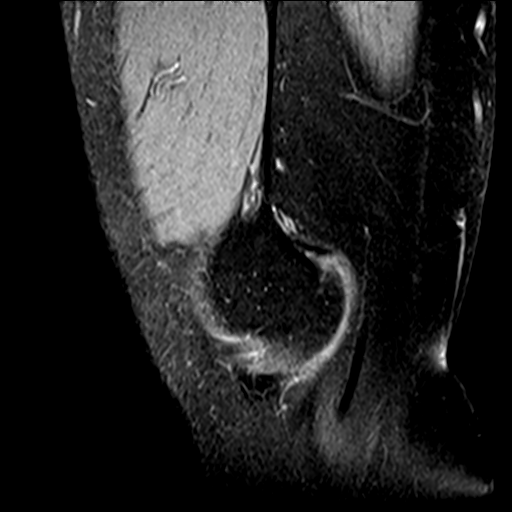
[im 27/27]
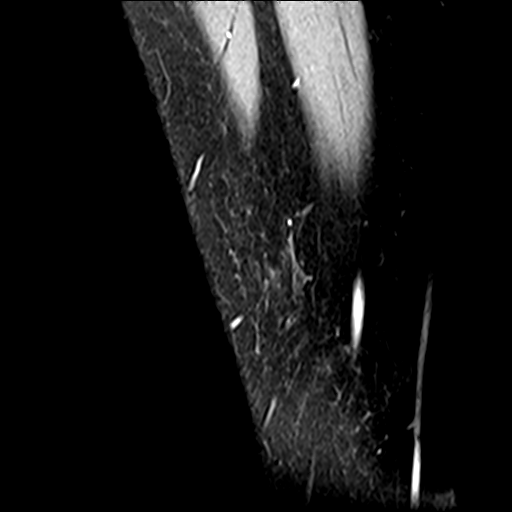

[Series 9: PD fat-sat · oblique · 2.0mm · 0.29mm/px · 3 of 11 slices shown (3 of 3)]
[im 1/11]
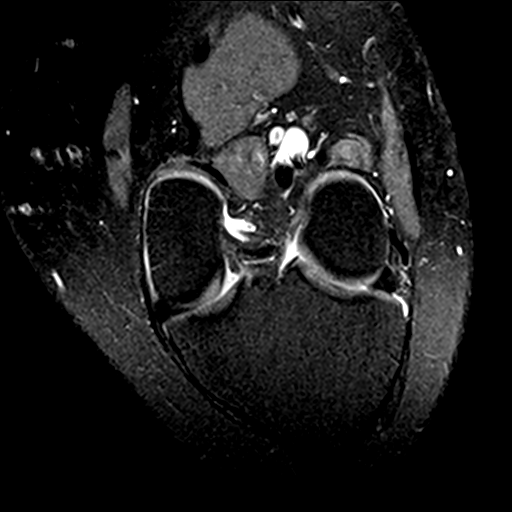
[im 6/11]
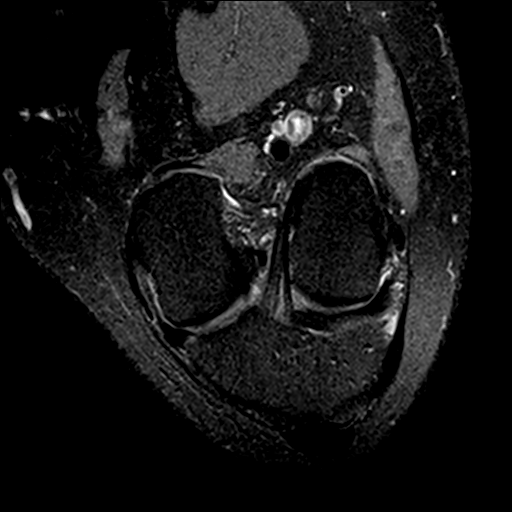
[im 11/11]
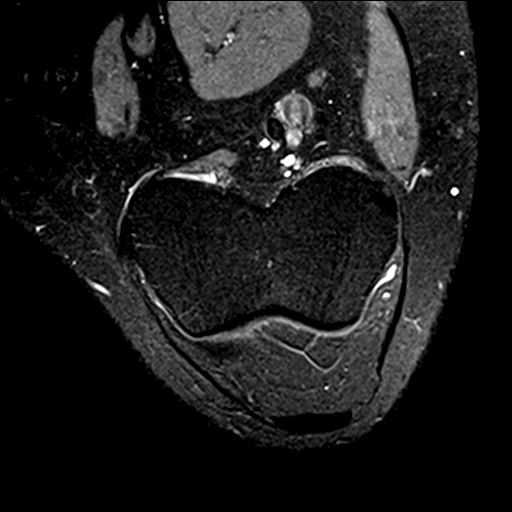

[22 of 40 positions shown; findings below may reference images not displayed]

FINDINGS: MENISCI

Medial meniscus:  Intact.

Lateral meniscus:  Intact.

LIGAMENTS

Cruciates:  Intact ACL and PCL.

Collaterals: Medial collateral ligament is intact. Lateral
collateral ligament complex is intact.

CARTILAGE

Patellofemoral: Minimal focal partial-thickness chondral surface
irregularity of the superior aspect of the lateral patellar facet
near the patellar apex. No trochlear chondral defect.

Medial:  No chondral defect.

Lateral:  No chondral defect.

Joint:  No joint effusion.  Unremarkable fat pads.

Popliteal Fossa:  No Baker cyst. Intact popliteus tendon.

Extensor Mechanism:  Intact quadriceps tendon and patellar tendon.

Bones: No focal marrow signal abnormality. No fracture or
dislocation.

Other: No soft tissue edema or fluid collection. Normal muscle bulk
and signal intensity.
IMPRESSION: 1. No evidence of internal derangement of the left knee.
2. Minimal focal partial-thickness chondral surface irregularity of
the superior aspect of the lateral patellar facet near the patellar
apex.

## 2022-03-11 DIAGNOSIS — F4322 Adjustment disorder with anxiety: Secondary | ICD-10-CM | POA: Diagnosis not present

## 2022-03-17 DIAGNOSIS — F3181 Bipolar II disorder: Secondary | ICD-10-CM | POA: Diagnosis not present

## 2022-03-17 DIAGNOSIS — F4312 Post-traumatic stress disorder, chronic: Secondary | ICD-10-CM | POA: Diagnosis not present

## 2022-03-17 DIAGNOSIS — F909 Attention-deficit hyperactivity disorder, unspecified type: Secondary | ICD-10-CM | POA: Diagnosis not present

## 2022-03-19 DIAGNOSIS — F4322 Adjustment disorder with anxiety: Secondary | ICD-10-CM | POA: Diagnosis not present

## 2022-03-24 ENCOUNTER — Encounter: Payer: BC Managed Care – PPO | Admitting: Family

## 2022-03-24 DIAGNOSIS — H9202 Otalgia, left ear: Secondary | ICD-10-CM | POA: Diagnosis not present

## 2022-03-24 DIAGNOSIS — H8112 Benign paroxysmal vertigo, left ear: Secondary | ICD-10-CM | POA: Diagnosis not present

## 2022-03-24 DIAGNOSIS — R110 Nausea: Secondary | ICD-10-CM | POA: Diagnosis not present

## 2022-03-25 DIAGNOSIS — F4322 Adjustment disorder with anxiety: Secondary | ICD-10-CM | POA: Diagnosis not present

## 2022-04-02 DIAGNOSIS — F4322 Adjustment disorder with anxiety: Secondary | ICD-10-CM | POA: Diagnosis not present

## 2022-04-09 DIAGNOSIS — F4322 Adjustment disorder with anxiety: Secondary | ICD-10-CM | POA: Diagnosis not present

## 2022-04-16 DIAGNOSIS — F4322 Adjustment disorder with anxiety: Secondary | ICD-10-CM | POA: Diagnosis not present

## 2022-04-18 DIAGNOSIS — F4322 Adjustment disorder with anxiety: Secondary | ICD-10-CM | POA: Diagnosis not present

## 2022-04-23 DIAGNOSIS — F4322 Adjustment disorder with anxiety: Secondary | ICD-10-CM | POA: Diagnosis not present

## 2022-04-25 DIAGNOSIS — F4322 Adjustment disorder with anxiety: Secondary | ICD-10-CM | POA: Diagnosis not present

## 2022-05-05 DIAGNOSIS — F4322 Adjustment disorder with anxiety: Secondary | ICD-10-CM | POA: Diagnosis not present

## 2022-05-06 DIAGNOSIS — Z1231 Encounter for screening mammogram for malignant neoplasm of breast: Secondary | ICD-10-CM | POA: Diagnosis not present

## 2022-05-12 DIAGNOSIS — F3181 Bipolar II disorder: Secondary | ICD-10-CM | POA: Diagnosis not present

## 2022-05-12 DIAGNOSIS — F4312 Post-traumatic stress disorder, chronic: Secondary | ICD-10-CM | POA: Diagnosis not present

## 2022-05-12 DIAGNOSIS — F909 Attention-deficit hyperactivity disorder, unspecified type: Secondary | ICD-10-CM | POA: Diagnosis not present

## 2022-05-17 DIAGNOSIS — F4322 Adjustment disorder with anxiety: Secondary | ICD-10-CM | POA: Diagnosis not present

## 2022-05-21 DIAGNOSIS — F4322 Adjustment disorder with anxiety: Secondary | ICD-10-CM | POA: Diagnosis not present

## 2022-05-27 DIAGNOSIS — F4322 Adjustment disorder with anxiety: Secondary | ICD-10-CM | POA: Diagnosis not present

## 2022-06-03 DIAGNOSIS — F4322 Adjustment disorder with anxiety: Secondary | ICD-10-CM | POA: Diagnosis not present

## 2022-06-09 DIAGNOSIS — F3181 Bipolar II disorder: Secondary | ICD-10-CM | POA: Diagnosis not present

## 2022-06-09 DIAGNOSIS — F4322 Adjustment disorder with anxiety: Secondary | ICD-10-CM | POA: Diagnosis not present

## 2022-06-09 DIAGNOSIS — F4312 Post-traumatic stress disorder, chronic: Secondary | ICD-10-CM | POA: Diagnosis not present

## 2022-06-09 DIAGNOSIS — F909 Attention-deficit hyperactivity disorder, unspecified type: Secondary | ICD-10-CM | POA: Diagnosis not present

## 2022-06-11 DIAGNOSIS — F4322 Adjustment disorder with anxiety: Secondary | ICD-10-CM | POA: Diagnosis not present

## 2022-06-16 DIAGNOSIS — F4322 Adjustment disorder with anxiety: Secondary | ICD-10-CM | POA: Diagnosis not present

## 2022-06-25 DIAGNOSIS — F4322 Adjustment disorder with anxiety: Secondary | ICD-10-CM | POA: Diagnosis not present

## 2022-09-01 DIAGNOSIS — F4322 Adjustment disorder with anxiety: Secondary | ICD-10-CM | POA: Diagnosis not present

## 2022-09-08 DIAGNOSIS — F3181 Bipolar II disorder: Secondary | ICD-10-CM | POA: Diagnosis not present

## 2022-09-08 DIAGNOSIS — F4312 Post-traumatic stress disorder, chronic: Secondary | ICD-10-CM | POA: Diagnosis not present

## 2022-09-08 DIAGNOSIS — F909 Attention-deficit hyperactivity disorder, unspecified type: Secondary | ICD-10-CM | POA: Diagnosis not present

## 2022-09-10 DIAGNOSIS — F4322 Adjustment disorder with anxiety: Secondary | ICD-10-CM | POA: Diagnosis not present

## 2022-09-15 DIAGNOSIS — F4322 Adjustment disorder with anxiety: Secondary | ICD-10-CM | POA: Diagnosis not present

## 2022-12-16 DIAGNOSIS — F4322 Adjustment disorder with anxiety: Secondary | ICD-10-CM | POA: Diagnosis not present

## 2023-08-02 DIAGNOSIS — R509 Fever, unspecified: Secondary | ICD-10-CM | POA: Diagnosis not present

## 2023-08-02 DIAGNOSIS — Z20822 Contact with and (suspected) exposure to covid-19: Secondary | ICD-10-CM | POA: Diagnosis not present

## 2023-08-02 DIAGNOSIS — J101 Influenza due to other identified influenza virus with other respiratory manifestations: Secondary | ICD-10-CM | POA: Diagnosis not present

## 2023-10-01 DIAGNOSIS — R198 Other specified symptoms and signs involving the digestive system and abdomen: Secondary | ICD-10-CM | POA: Diagnosis not present

## 2023-10-01 DIAGNOSIS — K921 Melena: Secondary | ICD-10-CM | POA: Diagnosis not present

## 2023-10-01 DIAGNOSIS — R1030 Lower abdominal pain, unspecified: Secondary | ICD-10-CM | POA: Diagnosis not present

## 2023-10-12 DIAGNOSIS — F3181 Bipolar II disorder: Secondary | ICD-10-CM | POA: Diagnosis not present

## 2023-10-12 DIAGNOSIS — F4312 Post-traumatic stress disorder, chronic: Secondary | ICD-10-CM | POA: Diagnosis not present

## 2023-10-12 DIAGNOSIS — F909 Attention-deficit hyperactivity disorder, unspecified type: Secondary | ICD-10-CM | POA: Diagnosis not present

## 2023-10-17 DIAGNOSIS — F3111 Bipolar disorder, current episode manic without psychotic features, mild: Secondary | ICD-10-CM | POA: Diagnosis not present

## 2023-10-29 DIAGNOSIS — F3111 Bipolar disorder, current episode manic without psychotic features, mild: Secondary | ICD-10-CM | POA: Diagnosis not present

## 2023-11-19 DIAGNOSIS — F3111 Bipolar disorder, current episode manic without psychotic features, mild: Secondary | ICD-10-CM | POA: Diagnosis not present

## 2023-12-09 DIAGNOSIS — F3111 Bipolar disorder, current episode manic without psychotic features, mild: Secondary | ICD-10-CM | POA: Diagnosis not present

## 2024-01-04 DIAGNOSIS — F3111 Bipolar disorder, current episode manic without psychotic features, mild: Secondary | ICD-10-CM | POA: Diagnosis not present

## 2024-01-21 DIAGNOSIS — F3111 Bipolar disorder, current episode manic without psychotic features, mild: Secondary | ICD-10-CM | POA: Diagnosis not present

## 2024-02-01 DIAGNOSIS — F3181 Bipolar II disorder: Secondary | ICD-10-CM | POA: Diagnosis not present

## 2024-02-01 DIAGNOSIS — F909 Attention-deficit hyperactivity disorder, unspecified type: Secondary | ICD-10-CM | POA: Diagnosis not present

## 2024-02-01 DIAGNOSIS — F4312 Post-traumatic stress disorder, chronic: Secondary | ICD-10-CM | POA: Diagnosis not present

## 2024-02-03 DIAGNOSIS — F3111 Bipolar disorder, current episode manic without psychotic features, mild: Secondary | ICD-10-CM | POA: Diagnosis not present

## 2024-02-20 DIAGNOSIS — F3111 Bipolar disorder, current episode manic without psychotic features, mild: Secondary | ICD-10-CM | POA: Diagnosis not present

## 2024-03-30 DIAGNOSIS — F3111 Bipolar disorder, current episode manic without psychotic features, mild: Secondary | ICD-10-CM | POA: Diagnosis not present

## 2024-04-12 DIAGNOSIS — F3111 Bipolar disorder, current episode manic without psychotic features, mild: Secondary | ICD-10-CM | POA: Diagnosis not present

## 2024-04-19 DIAGNOSIS — F909 Attention-deficit hyperactivity disorder, unspecified type: Secondary | ICD-10-CM | POA: Diagnosis not present

## 2024-04-19 DIAGNOSIS — F331 Major depressive disorder, recurrent, moderate: Secondary | ICD-10-CM | POA: Diagnosis not present

## 2024-04-19 DIAGNOSIS — F410 Panic disorder [episodic paroxysmal anxiety] without agoraphobia: Secondary | ICD-10-CM | POA: Diagnosis not present

## 2024-04-19 DIAGNOSIS — F4312 Post-traumatic stress disorder, chronic: Secondary | ICD-10-CM | POA: Diagnosis not present

## 2024-05-02 DIAGNOSIS — F3111 Bipolar disorder, current episode manic without psychotic features, mild: Secondary | ICD-10-CM | POA: Diagnosis not present

## 2024-05-03 DIAGNOSIS — Z1231 Encounter for screening mammogram for malignant neoplasm of breast: Secondary | ICD-10-CM | POA: Diagnosis not present

## 2024-05-03 LAB — HM MAMMOGRAPHY

## 2024-05-05 ENCOUNTER — Encounter: Payer: Self-pay | Admitting: Family

## 2024-05-10 ENCOUNTER — Ambulatory Visit (INDEPENDENT_AMBULATORY_CARE_PROVIDER_SITE_OTHER): Admitting: Family

## 2024-05-10 ENCOUNTER — Encounter: Payer: Self-pay | Admitting: Family

## 2024-05-10 VITALS — BP 110/68 | HR 76 | Temp 98.1°F | Ht 70.0 in | Wt 245.6 lb

## 2024-05-10 DIAGNOSIS — Z6835 Body mass index (BMI) 35.0-35.9, adult: Secondary | ICD-10-CM

## 2024-05-10 DIAGNOSIS — F321 Major depressive disorder, single episode, moderate: Secondary | ICD-10-CM

## 2024-05-10 DIAGNOSIS — K5909 Other constipation: Secondary | ICD-10-CM | POA: Diagnosis not present

## 2024-05-10 DIAGNOSIS — Z0000 Encounter for general adult medical examination without abnormal findings: Secondary | ICD-10-CM | POA: Diagnosis not present

## 2024-05-10 DIAGNOSIS — E66812 Obesity, class 2: Secondary | ICD-10-CM

## 2024-05-10 DIAGNOSIS — Z8742 Personal history of other diseases of the female genital tract: Secondary | ICD-10-CM

## 2024-05-10 LAB — COMPREHENSIVE METABOLIC PANEL WITH GFR
ALT: 23 U/L (ref 0–35)
AST: 21 U/L (ref 0–37)
Albumin: 4.2 g/dL (ref 3.5–5.2)
Alkaline Phosphatase: 66 U/L (ref 39–117)
BUN: 16 mg/dL (ref 6–23)
CO2: 26 meq/L (ref 19–32)
Calcium: 9 mg/dL (ref 8.4–10.5)
Chloride: 104 meq/L (ref 96–112)
Creatinine, Ser: 0.73 mg/dL (ref 0.40–1.20)
GFR: 100.91 mL/min (ref >60.00)
Glucose, Bld: 84 mg/dL (ref 70–99)
Potassium: 4.1 meq/L (ref 3.5–5.1)
Sodium: 137 meq/L (ref 135–145)
Total Bilirubin: 0.4 mg/dL (ref 0.2–1.2)
Total Protein: 7.1 g/dL (ref 6.0–8.3)

## 2024-05-10 LAB — TSH: TSH: 2.07 u[IU]/mL (ref 0.35–5.50)

## 2024-05-10 LAB — CBC WITH DIFFERENTIAL/PLATELET
Basophils Absolute: 0.1 K/uL (ref 0.0–0.1)
Basophils Relative: 0.9 % (ref 0.0–3.0)
Eosinophils Absolute: 0.1 K/uL (ref 0.0–0.7)
Eosinophils Relative: 1 % (ref 0.0–5.0)
HCT: 42.1 % (ref 36.0–46.0)
Hemoglobin: 14.1 g/dL (ref 12.0–15.0)
Lymphocytes Relative: 31.4 % (ref 12.0–46.0)
Lymphs Abs: 2.2 K/uL (ref 0.7–4.0)
MCHC: 33.4 g/dL (ref 30.0–36.0)
MCV: 85.5 fl (ref 78.0–100.0)
Monocytes Absolute: 0.5 K/uL (ref 0.1–1.0)
Monocytes Relative: 6.6 % (ref 3.0–12.0)
Neutro Abs: 4.3 K/uL (ref 1.4–7.7)
Neutrophils Relative %: 60.1 % (ref 43.0–77.0)
Platelets: 277 K/uL (ref 150.0–400.0)
RBC: 4.92 Mil/uL (ref 3.87–5.11)
RDW: 14 % (ref 11.5–15.5)
WBC: 7.1 K/uL (ref 4.0–10.5)

## 2024-05-10 LAB — LIPID PANEL
Cholesterol: 185 mg/dL (ref 0–200)
HDL: 44.6 mg/dL (ref >39.00)
LDL Cholesterol: 123 mg/dL — ABNORMAL HIGH (ref 0–99)
NonHDL: 140.29
Total CHOL/HDL Ratio: 4
Triglycerides: 86 mg/dL (ref 0.0–149.0)
VLDL: 17.2 mg/dL (ref 0.0–40.0)

## 2024-05-10 NOTE — Progress Notes (Signed)
 Phone [phone]  Subjective:   Patient is a 43 y.o. female presenting for annual physical.    Chief Complaint  Patient presents with   Annual Exam    Here for annual exam with PCP. Has not fasted for labs today. Has a scheduled gyn appointment for PAP and IUD removal in Dec.   Discussed the use of AI scribe software for clinical note transcription with the patient, who gave verbal consent to proceed.  History of Present Illness Tawonna Esquer is a 43 year old female with PCOS who presents for a GYN consultation regarding her IUD.  She has had the IUD for 14 years and has not experienced a menstrual cycle in 15 years while using it. Prior to the IUD, she had very heavy periods twice a year. The IUD helps manage her PCOS symptoms.  She experiences cyclical migraines and avoids triggers such as alcohol. Her diet is mostly plant-based, with occasional chicken and fish, and she her son has a shellfish allergy, but ok to eat regular fish. She has cyclical constipation, managed with dietary changes including increased plant-based foods and water intake. She is a former smoker, having smoked for less than ten years, and consumes minimal alcohol due to personal choice and family history of alcoholism. She maintains an active lifestyle, engaging in regular physical activity and often exceeding 10,000 steps a day with her job. Her past medical history includes a normal mammogram and a normal colonoscopy in December 2021. She is stable on Lamictal, Prozac & Seroquel with a reduced dosage over time.  See problem oriented charting- ROS- full  review of systems was completed and negative except for what is noted in HPI above.  The following were reviewed and entered/updated in epic: Past Medical History:  Diagnosis Date   Allergy    Anxiety    GERD (gastroesophageal reflux disease)    Headache    Intracranial cavernous hemangioma (HCC) 08/01/2015   PATIENT NAME JANAYSIA MCLEROY  MR NUMBER  [id number]  EXAM COMPLETED 08/29/2014 15:58  ACCOUNT NUMBER [id number]  ORDERING PHYSICIAN MILLER, LISA L    -------------ADDITIONAL INFORMATION FROM PATIENT ORDER-------------  REASON headache  COMMENTS  NOTE  ----------------END OF ADDITIONAL INFORMATION----------------  CLINICAL DATA: Headache  -  EXAM:  MRI HEAD WITHOUT CONTRAST  TECHNIQUE:  Multiplanar, mul   Kidney infection    Major depression    PCOS (polycystic ovarian syndrome)    Patient Active Problem List   Diagnosis Date Noted   Class 2 obesity without serious comorbidity with body mass index (BMI) of 35.0 to 35.9 in adult 05/10/2024   Abnormal weight gain 02/25/2022   Hepatic steatosis 02/19/2022   Pulmonary nodule less than 6 mm determined by computed tomography of lung 02/19/2022   Liletta  IUD (intrauterine device) in place since 01/23/2017 12/13/2019   Chronic migraine without aura without status migrainosus, not intractable 10/06/2016   Ovarian cyst, left 09/09/2016   MDD (major depressive disorder), recurrent episode, severe (HCC) 08/01/2015   Panic type anxiety neurosis 08/01/2015   Intentional self-harm by sharp object in home as place of occurrence, subsequent encounter 08/01/2015   Dysfunctional family due to alcoholism 08/01/2015   Family history of alcoholism in father 08/01/2015   Family history of alcoholism in mother 08/01/2015   History of PCOS 08/01/2015   Past Surgical History:  Procedure Laterality Date   WISDOM TOOTH EXTRACTION  08/2015    Family History  Problem Relation Age of Onset   Heart disease Mother    Cancer  Mother        skin   Alcohol abuse Mother    Depression Mother    COPD Mother    Drug abuse Mother    Prostate cancer Father    Throat cancer Father    Cancer Father        skin   Alcohol abuse Father    Drug abuse Father    Depression Father    Dementia Father        lewey body   Obesity Father    Bipolar disorder Sister    Depression Sister    Drug abuse Sister    Anxiety  disorder Brother    ADD / ADHD Brother    Asthma Brother    Learning disabilities Brother    Obesity Brother    Kidney failure Maternal Grandmother    Heart disease Maternal Grandmother    Brain cancer Maternal Grandfather    Lung cancer Maternal Grandfather    Cancer Maternal Grandfather    Stroke Maternal Grandfather    Autism Son    Anxiety disorder Paternal Grandmother    Obesity Paternal Grandmother    ADD / ADHD Son    Asthma Son    Intellectual disability Son    Learning disabilities Son     Medications- reviewed and updated Current Outpatient Medications  Medication Sig Dispense Refill   FLUoxetine (PROZAC) 20 MG capsule Take 20 mg by mouth daily.     ibuprofen (ADVIL) 400 MG tablet Take by mouth. (Patient taking differently: Take by mouth every 6 (six) hours as needed.)     lamoTRIgine (LAMICTAL) 200 MG tablet Take 400 mg by mouth daily.     levonorgestrel  (MIRENA ) 20 MCG/24HR IUD 1 each by Intrauterine route continuous.     QUEtiapine (SEROQUEL) 300 MG tablet Take 300 mg by mouth at bedtime.     amphetamine-dextroamphetamine (ADDERALL) 20 MG tablet Take 10-20 mg by mouth 2 (two) times daily. TAKES 10 MG IN THE AM AND 20 MG IN THE AFTERNOON (Patient not taking: Reported on 05/10/2024)     clonazePAM (KLONOPIN) 1 MG tablet Take by mouth 3 (three) times daily. Takes 1/2 mg at bedtime and can take up to 1 mg during the day as needed (Patient not taking: Reported on 05/10/2024)     doxycycline  (VIBRA -TABS) 100 MG tablet Take 1 tablet (100 mg total) by mouth 2 (two) times daily. (Patient not taking: Reported on 05/10/2024) 14 tablet 0   Multiple Vitamins-Minerals (MULTIVITAMIN GUMMIES WOMENS) CHEW  (Patient not taking: Reported on 05/10/2024)     SALINE NASAL MIST NA Saline     No current facility-administered medications for this visit.    Allergies-reviewed and updated Allergies  Allergen Reactions   Lithium  Nausea And Vomiting, Nausea Only and Other (See Comments)    Asa [Aspirin] Other (See Comments)    Patient stated that she can not take any blood thinners    Objective:  BP 110/68   Pulse 76   Temp 98.1 F (36.7 C) (Temporal)   Ht 5' 10 (1.778 m)   Wt 245 lb 9.6 oz (111.4 kg)   SpO2 98%   BMI 35.24 kg/m  Physical Exam Vitals and nursing note reviewed.  Constitutional:      Appearance: Normal appearance. She is not ill-appearing.     Interventions: Face mask in place.  HENT:     Head: Normocephalic.     Right Ear: Tympanic membrane and ear canal normal.     Left  Ear: Tympanic membrane and ear canal normal.     Nose:     Right Sinus: No frontal sinus tenderness.     Left Sinus: No frontal sinus tenderness.     Mouth/Throat:     Mouth: Mucous membranes are moist.     Pharynx: No pharyngeal swelling, oropharyngeal exudate, posterior oropharyngeal erythema or uvula swelling.     Tonsils: No tonsillar exudate or tonsillar abscesses.  Eyes:     Pupils: Pupils are equal, round, and reactive to light.  Cardiovascular:     Rate and Rhythm: Normal rate and regular rhythm.  Pulmonary:     Effort: Pulmonary effort is normal.     Breath sounds: Normal breath sounds.  Musculoskeletal:        General: Normal range of motion.     Cervical back: Normal range of motion.  Lymphadenopathy:     Head:     Right side of head: No preauricular or posterior auricular adenopathy.     Left side of head: No preauricular or posterior auricular adenopathy.     Cervical: No cervical adenopathy.  Skin:    General: Skin is warm and dry.  Neurological:     Mental Status: She is alert.  Psychiatric:        Mood and Affect: Mood normal.        Behavior: Behavior normal.     Assessment and Plan   Health Maintenance counseling: 1. Anticipatory guidance: Patient counseled regarding regular dental exams q6 months, eye exams,  avoiding smoking and second hand smoke, limiting alcohol to 1 beverage per day, no illicit drugs.   2. Risk factor reduction:  Advised  patient of need for regular exercise and diet rich with fruits and vegetables to reduce risk of heart attack and stroke.  Wt Readings from Last 3 Encounters:  05/10/24 245 lb 9.6 oz (111.4 kg)  03/06/22 244 lb 9.6 oz (110.9 kg)  02/25/22 242 lb (109.8 kg)   3. Immunizations/screenings/ancillary studies Immunization History  Administered Date(s) Administered   Influenza, Mdck, Trivalent,PF 6+ MOS(egg free) 03/12/2024   Influenza,inj,Quad PF,6+ Mos 03/19/2016, 04/05/2021, 02/25/2022   Influenza,inj,quad, With Preservative 05/29/2015   PFIZER(Purple Top)SARS-COV-2 Vaccination 09/15/2019, 10/10/2019, 05/12/2020   PPD Test 03/21/2021   Pfizer Covid-19 Vaccine Bivalent Booster 21yrs & up 04/21/2021   Pfizer(Comirnaty)Fall Seasonal Vaccine 12 years and older 04/17/2022   Rho (D) Immune Globulin 09/26/2009   Tdap 09/27/2009, 02/25/2022   Health Maintenance Due  Topic Date Due   Pneumococcal Vaccine (1 of 2 - PCV) Never done    4. Cervical cancer screening- scheduled for 05/2024 5. Breast cancer screening-  mammogram done 04/2024 6. Colon cancer screening -  Dec 2021- no polyps 7. Skin cancer screening- advised regular sunscreen use. Denies worrisome, changing, or new skin lesions.  8. Birth control/STD check- IUD/will get at GYN 9. Osteoporosis screening- N/A 10. Alcohol screening:  None 11. Smoking associated screening (lung cancer screening, AAA screen 65-75, UA)- ex- smoker- < 10 yr hx 12. Exercise-  walking, swimming, does some strength training  Assessment & Plan Adult Wellness Visit Emphasized exercise, diet, and lifestyle modifications. Reviewed normal mammogram and colonoscopy utd. Encouraged continued weight training for bone density and muscle building. Discussed plant-based diet, hydration, continued alcohol avoidance, and deferred pneumonia vaccination. - Continue regular exercise, including walking, swimming, and strength training. - Maintain a plant-based diet with adequate  hydration. - Deferred pneumonia vaccination for now.  Polycystic Ovary Syndrome (PCOS) PCOS managed with IUD, controlling symptoms and  menstrual cycles. Discussed IUD management with GYN at upcoming appt in Dec. - Consult with GYN regarding IUD management.  Cyclic Constipation Cyclic constipation possibly related to hormonal cycles. Managed with dietary changes. Previous medication ineffective. - Continue dietary management with increased plant-based foods and water intake. - Consider B12 supplementation if dietary intake is insufficient.  Depression Managed with Lamictal, Prozac & Seroquel through psychiatry. Discussed potential primary care prescription for cost reduction. - Continue meds as prescribed by psychiatry. - Call office when refills needed - F/U 6mos  Obesity . Discussed importance of continued lifestyle modifications. - Maintain healthy lifestyle with regular exercise and balanced diet, decreased portions, low carbs/sweets, increase protein intake to 100g daily, eat most calories earlier in day.  Recommended follow up:  Return in about 6 months (around 11/07/2024) for depression, med refills. Future Appointments  Date Time Provider Department Center  06/20/2024  3:35 PM Nicholaus Burnard HERO, MD [facility]   Lab/Order associations:  not-fasting  Lucius Krabbe, NP

## 2024-05-10 NOTE — Patient Instructions (Addendum)
 It was very nice to see you today!  I will review your lab results via MyChart in a few days.  Stay well! Keep exercising!  Have a wonderful holiday season :-)      PLEASE NOTE:  If you had any lab tests please let us  know if you have not heard back within a few days. You may see your results on MyChart before we have a chance to review them but we will give you a call once they are reviewed by us . If we ordered any referrals today, please let us  know if you have not heard from their office within the next week.

## 2024-05-13 ENCOUNTER — Ambulatory Visit: Payer: Self-pay | Admitting: Family

## 2024-05-23 DIAGNOSIS — F909 Attention-deficit hyperactivity disorder, unspecified type: Secondary | ICD-10-CM | POA: Diagnosis not present

## 2024-05-23 DIAGNOSIS — F4312 Post-traumatic stress disorder, chronic: Secondary | ICD-10-CM | POA: Diagnosis not present

## 2024-05-23 DIAGNOSIS — F331 Major depressive disorder, recurrent, moderate: Secondary | ICD-10-CM | POA: Diagnosis not present

## 2024-05-23 DIAGNOSIS — F410 Panic disorder [episodic paroxysmal anxiety] without agoraphobia: Secondary | ICD-10-CM | POA: Diagnosis not present

## 2024-06-20 ENCOUNTER — Ambulatory Visit: Admitting: Obstetrics and Gynecology

## 2024-06-20 ENCOUNTER — Encounter: Payer: Self-pay | Admitting: Obstetrics and Gynecology

## 2024-06-20 ENCOUNTER — Other Ambulatory Visit: Payer: Self-pay

## 2024-06-20 ENCOUNTER — Other Ambulatory Visit (HOSPITAL_COMMUNITY)
Admission: RE | Admit: 2024-06-20 | Discharge: 2024-06-20 | Disposition: A | Discharge status: Home / Self Care | Source: Ambulatory Visit | Attending: Obstetrics and Gynecology | Admitting: Obstetrics and Gynecology

## 2024-06-20 VITALS — BP 109/73 | HR 72 | Wt 245.8 lb

## 2024-06-20 DIAGNOSIS — Z975 Presence of (intrauterine) contraceptive device: Secondary | ICD-10-CM

## 2024-06-20 DIAGNOSIS — Z83719 Family history of colon polyps, unspecified: Secondary | ICD-10-CM | POA: Insufficient documentation

## 2024-06-20 DIAGNOSIS — Z124 Encounter for screening for malignant neoplasm of cervix: Secondary | ICD-10-CM | POA: Insufficient documentation

## 2024-06-20 DIAGNOSIS — N951 Menopausal and female climacteric states: Secondary | ICD-10-CM

## 2024-06-20 DIAGNOSIS — Z131 Encounter for screening for diabetes mellitus: Secondary | ICD-10-CM | POA: Diagnosis not present

## 2024-06-20 DIAGNOSIS — R1313 Dysphagia, pharyngeal phase: Secondary | ICD-10-CM | POA: Insufficient documentation

## 2024-06-20 DIAGNOSIS — F439 Reaction to severe stress, unspecified: Secondary | ICD-10-CM | POA: Insufficient documentation

## 2024-06-20 DIAGNOSIS — Z01419 Encounter for gynecological examination (general) (routine) without abnormal findings: Secondary | ICD-10-CM

## 2024-06-20 DIAGNOSIS — E282 Polycystic ovarian syndrome: Secondary | ICD-10-CM | POA: Diagnosis not present

## 2024-06-20 DIAGNOSIS — R1030 Lower abdominal pain, unspecified: Secondary | ICD-10-CM | POA: Insufficient documentation

## 2024-06-20 NOTE — Progress Notes (Signed)
 "   GYNECOLOGY ANNUAL PREVENTATIVE CARE ENCOUNTER NOTE  Subjective:   Heather Gardner is a 43 y.o. G21P1001 female here for a annual gynecologic exam. Current complaints: wants to know if she needs to continue IUD  Has diagnosis of PCOS and has not had period since before sone was born (who is 57). Has had Mirena  IUD since 2018 and does not have periods with them. Is starting to get night sweats and some acne.   Denies abnormal vaginal bleeding, discharge, pelvic pain, problems with intercourse or other gynecologic concerns. Declines STI screen.   Gynecologic History No LMP recorded. (Menstrual status: IUD). Contraception: IUD Last Pap: 2021. Results: negative Last mammogram: 04/2024. Results: Birads-1 DEXA: has never had  Obstetric History OB History  Gravida Para Term Preterm AB Living  1 1 1   1   SAB IAB Ectopic Multiple Live Births      1    # Outcome Date GA Lbr Len/2nd Weight Sex Type Anes PTL Lv  1 Term 09/25/09 [redacted]w[redacted]d  8 lb 9 oz (3.884 kg) M    LIV    Past Medical History:  Diagnosis Date   Allergy    Anxiety    Constipation 08/30/2020   Dyslipidemia (high LDL; low HDL) 12/06/2020   Family history of colonic polyps 06/20/2024   GERD (gastroesophageal reflux disease)    Headache    Hematochezia 08/30/2020   Hematochezia 08/30/2020   Hypothyroidism 11/06/2009   Impacted cerumen of left ear 11/11/2021   Intracranial cavernous hemangioma (HCC) 08/01/2015   PATIENT NAME Heather Gardner  MR NUMBER [id number]  EXAM COMPLETED 08/29/2014 15:58  ACCOUNT NUMBER [id number]  ORDERING PHYSICIAN MILLER, LISA L    -------------ADDITIONAL INFORMATION FROM PATIENT ORDER-------------  REASON headache  COMMENTS  NOTE  ----------------END OF ADDITIONAL INFORMATION----------------  CLINICAL DATA: Headache  -  EXAM:  MRI HEAD WITHOUT CONTRAST  TECHNIQUE:  Multiplanar, mul   Irregular bowel habits 08/30/2020   Irregular menses 06/01/2003   Kidney infection    Laryngopharyngeal reflux  11/11/2021   Low HDL (under 40) 12/06/2020   Lower abdominal pain 06/20/2024   Major depression    Major depression, single episode 08/30/2020   Metabolic syndrome 12/06/2020   MRI of brain abnormal 03/28/2004   Nasal septal deviation 11/11/2021   Pain in joints 12/28/2003   PCOS (polycystic ovarian syndrome)    Pharyngeal dysphagia 06/20/2024   Sinusitis 06/01/2003   Stress 06/20/2024   Supervision of normal first pregnancy 07/18/2009   Throat clearing 11/11/2021   Tobacco user 08/30/2020    Past Surgical History:  Procedure Laterality Date   WISDOM TOOTH EXTRACTION  08/2015    Medications Ordered Prior to Encounter[1]  Allergies[2]  Social History   Socioeconomic History   Marital status: Single    Spouse name: Not on file   Number of children: 1   Years of education: Not on file   Highest education level: Master's degree (e.g., MA, MS, MEng, MEd, MSW, MBA)  Occupational History   Occupation: [employer]  Tobacco Use   Smoking status: Former    Current packs/day: 0.00    Average packs/day: 0.3 packs/day    Types: Cigarettes    Quit date: 10/28/2021    Years since quitting: 2.6   Smokeless tobacco: Never   Tobacco comments:    08/22/15 smoke when stressed  Vaping Use   Vaping status: Never Used  Substance and Sexual Activity   Alcohol use: No    Alcohol/week: 0.0 standard drinks of  alcohol   Drug use: No   Sexual activity: Yes    Birth control/protection: I.U.D.  Other Topics Concern   Not on file  Social History Narrative   Not on file   Social Drivers of Health   Tobacco Use: Medium Risk (06/20/2024)   Patient History    Smoking Tobacco Use: Former    Smokeless Tobacco Use: Never    Passive Exposure: Not on file  Financial Resource Strain: Not on file  Food Insecurity: No Food Insecurity (06/20/2024)   Epic    Worried About Programme Researcher, Broadcasting/film/video in the Last Year: Never true    Ran Out of Food in the Last Year: Never true  Transportation  Needs: No Transportation Needs (06/20/2024)   Epic    Lack of Transportation (Medical): No    Lack of Transportation (Non-Medical): No  Physical Activity: Not on file  Stress: Not on file  Social Connections: Not on file  Intimate Partner Violence: Not on file  Depression (PHQ2-9): Low Risk (06/20/2024)   Depression (PHQ2-9)    PHQ-2 Score: 0  Alcohol Screen: Not on file  Housing: Not on file  Utilities: Not on file  Health Literacy: Not on file    Family History  Problem Relation Age of Onset   Heart disease Mother    Cancer Mother        skin   Alcohol abuse Mother    Depression Mother    COPD Mother    Drug abuse Mother    Prostate cancer Father    Throat cancer Father    Cancer Father        skin   Alcohol abuse Father    Drug abuse Father    Depression Father    Dementia Father        lewey body   Obesity Father    Bipolar disorder Sister    Depression Sister    Drug abuse Sister    Anxiety disorder Brother    ADD / ADHD Brother    Asthma Brother    Learning disabilities Brother    Obesity Brother    Kidney failure Maternal Grandmother    Heart disease Maternal Grandmother    Brain cancer Maternal Grandfather    Lung cancer Maternal Grandfather    Cancer Maternal Grandfather    Stroke Maternal Grandfather    Autism Son    Anxiety disorder Paternal Grandmother    Obesity Paternal Grandmother    ADD / ADHD Son    Asthma Son    Intellectual disability Son    Learning disabilities Son     The following portions of the patient's history were reviewed and updated as appropriate: allergies, current medications, past family history, past medical history, past social history, past surgical history and problem list.  Review of Systems Pertinent items are noted in HPI.   Objective:  BP 109/73   Pulse 72   Wt 245 lb 12.8 oz (111.5 kg)   BMI 35.27 kg/m  CONSTITUTIONAL: Well-developed, well-nourished female in no acute distress.  HENT:  Normocephalic,  atraumatic, External right and left ear normal. Oropharynx is clear and moist EYES: Conjunctivae and EOM are normal. Pupils are equal, round, and reactive to light. No scleral icterus.  NECK: Normal range of motion, supple, no masses.  Normal thyroid .  SKIN: Skin is warm and dry. No rash noted. Not diaphoretic. No erythema. No pallor. NEUROLOGIC: Alert and oriented to person, place, and time. Normal reflexes, muscle tone coordination. No  cranial nerve deficit noted. PSYCHIATRIC: Normal mood and affect. Normal behavior. Normal judgment and thought content. CARDIOVASCULAR: Normal heart rate noted RESPIRATORY: Effort normal, no problems with respiration noted. BREASTS: deferred ABDOMEN: Soft, no distention noted.  No tenderness, rebound or guarding.  PELVIC: Normal appearing external genitalia; normal appearing vaginal mucosa and cervix.  No abnormal discharge noted.  Pap smear obtained. IUD strings protruding from os. Normal uterine size, no other palpable masses, no uterine or adnexal tenderness. MUSCULOSKELETAL: Normal range of motion. No tenderness.  No cyanosis, clubbing, or edema.   Exam done with chaperone present.   Assessment and Plan:  1. Well woman exam (Primary) Normal exam  2. Cervical cancer screening - Cytology - PAP( [place])  3. Diabetes mellitus screening - Hemoglobin A1c  4. PCOS (polycystic ovarian syndrome) Reviewed recommendation for continuing hormonal management for endometrial protection until menopause - she is on year 7 of IUD in place and doing well with it - as she is doing well, discussed option of removing IUD, seeing if she is menopausal given that sh ehas had some perimenopausal symptoms, but then needing further management for PCOS if she starts having periods again VS keeping IUD in and changing next year with plans to keep in until definitely menopausla (~8 year or so, reassess prn) vs changing today - reviewed r/b of these scenarios, she would like  to keep it in and plan to change it next year - answered all questions, patient in agreement with plan - will return for change 6-10 months  5. Hot flushes, perimenopausal May be perimenopausal Reviewed s/s, defn would be no periods > 1 year with no medication  6. IUD (intrauterine device) in place - strings seen on exam at os   Will follow up results of pap smear and manage accordingly. Encouraged improvement in diet and exercise.    Routine preventative health maintenance measures emphasized. Please refer to After Visit Summary for other counseling recommendations.    LOIS Yolanda Moats, MD, [facility] Attending Center for [facility])     [1]  Current Outpatient Medications on File Prior to Visit  Medication Sig Dispense Refill   FLUoxetine (PROZAC) 20 MG capsule Take 20 mg by mouth daily.     gabapentin  (NEURONTIN ) 100 MG capsule Take 100-300 mg by mouth at bedtime as needed.     lamoTRIgine (LAMICTAL) 200 MG tablet Take 400 mg by mouth daily.     QUEtiapine (SEROQUEL) 300 MG tablet Take 300 mg by mouth at bedtime.     SALINE NASAL MIST NA Saline     ibuprofen (ADVIL) 400 MG tablet Take by mouth. (Patient taking differently: Take by mouth every 6 (six) hours as needed.)     levonorgestrel  (MIRENA ) 20 MCG/24HR IUD 1 each by Intrauterine route continuous.     No current facility-administered medications on file prior to visit.  [2]  Allergies Allergen Reactions   Lithium  Nausea And Vomiting, Nausea Only and Other (See Comments)   Asa [Aspirin] Other (See Comments)    Patient stated that she can not take any blood thinners   "

## 2024-06-21 LAB — HEMOGLOBIN A1C
Est. average glucose Bld gHb Est-mCnc: 111 mg/dL
Hgb A1c MFr Bld: 5.5 % (ref 4.8–5.6)

## 2024-06-22 LAB — CYTOLOGY - PAP
Comment: NEGATIVE
Diagnosis: NEGATIVE
Diagnosis: REACTIVE
High risk HPV: NEGATIVE

## 2024-06-27 ENCOUNTER — Ambulatory Visit: Payer: Self-pay | Admitting: Obstetrics and Gynecology

## 2024-12-14 ENCOUNTER — Ambulatory Visit: Admitting: Family

## 2025-05-22 ENCOUNTER — Encounter: Admitting: Family
